# Patient Record
Sex: Male | Born: 1975 | Race: Asian | Hispanic: No | Marital: Married | Smoking: Never smoker
Health system: Southern US, Community
[De-identification: ages and names within clinical notes are randomized; demographics above are authoritative.]

## PROBLEM LIST (undated history)

## (undated) DIAGNOSIS — R7611 Nonspecific reaction to tuberculin skin test without active tuberculosis: Secondary | ICD-10-CM

## (undated) DIAGNOSIS — R202 Paresthesia of skin: Secondary | ICD-10-CM

## (undated) DIAGNOSIS — R42 Dizziness and giddiness: Secondary | ICD-10-CM

## (undated) HISTORY — DX: Paresthesia of skin: R20.2

## (undated) HISTORY — DX: Nonspecific reaction to tuberculin skin test without active tuberculosis: R76.11

---

## 2008-06-30 ENCOUNTER — Emergency Department (HOSPITAL_COMMUNITY): Admission: EM | Admit: 2008-06-30 | Discharge: 2008-06-30 | Payer: Self-pay | Admitting: Emergency Medicine

## 2008-08-15 ENCOUNTER — Ambulatory Visit: Admission: RE | Admit: 2008-08-15 | Discharge: 2008-08-15 | Payer: Self-pay | Admitting: Internal Medicine

## 2008-10-10 ENCOUNTER — Encounter: Admission: RE | Admit: 2008-10-10 | Discharge: 2008-10-10 | Payer: Self-pay | Admitting: Infectious Diseases

## 2008-12-19 ENCOUNTER — Encounter: Admission: RE | Admit: 2008-12-19 | Discharge: 2008-12-19 | Payer: Self-pay | Admitting: Internal Medicine

## 2009-01-21 ENCOUNTER — Encounter: Admission: RE | Admit: 2009-01-21 | Discharge: 2009-01-21 | Payer: Self-pay | Admitting: Internal Medicine

## 2009-04-29 ENCOUNTER — Emergency Department (HOSPITAL_COMMUNITY): Admission: EM | Admit: 2009-04-29 | Discharge: 2009-04-30 | Payer: Self-pay | Admitting: Emergency Medicine

## 2010-04-07 ENCOUNTER — Encounter
Admission: RE | Admit: 2010-04-07 | Discharge: 2010-04-07 | Payer: Self-pay | Source: Home / Self Care | Attending: Internal Medicine | Admitting: Internal Medicine

## 2010-04-25 ENCOUNTER — Encounter: Payer: Self-pay | Admitting: Internal Medicine

## 2010-04-26 ENCOUNTER — Encounter: Payer: Self-pay | Admitting: Internal Medicine

## 2010-05-27 ENCOUNTER — Emergency Department (HOSPITAL_COMMUNITY)
Admission: EM | Admit: 2010-05-27 | Discharge: 2010-05-28 | Disposition: A | Discharge status: Home / Self Care | Payer: 59 | Attending: Emergency Medicine | Admitting: Emergency Medicine

## 2010-05-27 DIAGNOSIS — J45909 Unspecified asthma, uncomplicated: Secondary | ICD-10-CM | POA: Insufficient documentation

## 2010-05-27 DIAGNOSIS — R1011 Right upper quadrant pain: Secondary | ICD-10-CM | POA: Insufficient documentation

## 2010-05-27 LAB — COMPREHENSIVE METABOLIC PANEL
AST: 24 U/L (ref 0–37)
Albumin: 4 g/dL (ref 3.5–5.2)
BUN: 7 mg/dL (ref 6–23)
Calcium: 9.4 mg/dL (ref 8.4–10.5)
Creatinine, Ser: 0.87 mg/dL (ref 0.4–1.5)
Total Bilirubin: 0.2 mg/dL — ABNORMAL LOW (ref 0.3–1.2)

## 2010-05-27 LAB — DIFFERENTIAL
Eosinophils Absolute: 0.5 10*3/uL (ref 0.0–0.7)
Eosinophils Relative: 6 % — ABNORMAL HIGH (ref 0–5)
Lymphs Abs: 2.5 10*3/uL (ref 0.7–4.0)
Monocytes Absolute: 0.7 10*3/uL (ref 0.1–1.0)
Monocytes Relative: 8 % (ref 3–12)

## 2010-05-27 LAB — LIPASE, BLOOD: Lipase: 45 U/L (ref 11–59)

## 2010-05-27 LAB — CBC
Platelets: 192 10*3/uL (ref 150–400)
RBC: 5.61 MIL/uL (ref 4.22–5.81)
RDW: 13.3 % (ref 11.5–15.5)
WBC: 8.9 10*3/uL (ref 4.0–10.5)

## 2010-05-28 LAB — URINALYSIS, ROUTINE W REFLEX MICROSCOPIC
Hgb urine dipstick: NEGATIVE
Ketones, ur: NEGATIVE mg/dL
Protein, ur: NEGATIVE mg/dL
Urine Glucose, Fasting: NEGATIVE mg/dL

## 2010-06-21 LAB — URINALYSIS, ROUTINE W REFLEX MICROSCOPIC
Bilirubin Urine: NEGATIVE
Hgb urine dipstick: NEGATIVE
Ketones, ur: NEGATIVE mg/dL
Nitrite: NEGATIVE
Protein, ur: NEGATIVE mg/dL
Specific Gravity, Urine: 1.016 (ref 1.005–1.030)
Urobilinogen, UA: 0.2 mg/dL (ref 0.0–1.0)

## 2010-06-21 LAB — COMPREHENSIVE METABOLIC PANEL
AST: 200 U/L — ABNORMAL HIGH (ref 0–37)
Albumin: 3.8 g/dL (ref 3.5–5.2)
Alkaline Phosphatase: 47 U/L (ref 39–117)
Chloride: 100 mEq/L (ref 96–112)
Creatinine, Ser: 0.84 mg/dL (ref 0.4–1.5)
Potassium: 3.3 mEq/L — ABNORMAL LOW (ref 3.5–5.1)
Total Bilirubin: 0.6 mg/dL (ref 0.3–1.2)
Total Protein: 7.1 g/dL (ref 6.0–8.3)

## 2010-06-21 LAB — DIFFERENTIAL
Basophils Absolute: 0 10*3/uL (ref 0.0–0.1)
Basophils Relative: 0 % (ref 0–1)
Eosinophils Absolute: 0.4 10*3/uL (ref 0.0–0.7)
Eosinophils Relative: 5 % (ref 0–5)
Monocytes Absolute: 0.7 10*3/uL (ref 0.1–1.0)
Monocytes Relative: 9 % (ref 3–12)

## 2010-06-21 LAB — HEPATITIS B SURFACE ANTIGEN: Hepatitis B Surface Ag: NEGATIVE

## 2010-06-21 LAB — CBC
MCHC: 33.7 g/dL (ref 30.0–36.0)
RBC: 5.22 MIL/uL (ref 4.22–5.81)
WBC: 8.1 10*3/uL (ref 4.0–10.5)

## 2010-06-21 LAB — HEPATIC FUNCTION PANEL
ALT: 310 U/L — ABNORMAL HIGH (ref 0–53)
Albumin: 3.5 g/dL (ref 3.5–5.2)
Alkaline Phosphatase: 46 U/L (ref 39–117)
Total Bilirubin: 0.4 mg/dL (ref 0.3–1.2)
Total Protein: 6.2 g/dL (ref 6.0–8.3)

## 2010-06-21 LAB — HEPATITIS PANEL, ACUTE
Hep A IgM: NEGATIVE
Hep B C IgM: NEGATIVE

## 2010-09-08 ENCOUNTER — Emergency Department (HOSPITAL_COMMUNITY)
Admission: EM | Admit: 2010-09-08 | Discharge: 2010-09-08 | Disposition: A | Discharge status: Home / Self Care | Payer: 59 | Attending: Emergency Medicine | Admitting: Emergency Medicine

## 2010-09-08 DIAGNOSIS — IMO0001 Reserved for inherently not codable concepts without codable children: Secondary | ICD-10-CM | POA: Insufficient documentation

## 2010-09-08 DIAGNOSIS — J45909 Unspecified asthma, uncomplicated: Secondary | ICD-10-CM | POA: Insufficient documentation

## 2010-09-08 LAB — URINALYSIS, ROUTINE W REFLEX MICROSCOPIC
Bilirubin Urine: NEGATIVE
Hgb urine dipstick: NEGATIVE
Nitrite: NEGATIVE
Protein, ur: NEGATIVE mg/dL
Urobilinogen, UA: 0.2 mg/dL (ref 0.0–1.0)

## 2010-09-08 LAB — POCT I-STAT, CHEM 8
Chloride: 104 mEq/L (ref 96–112)
Creatinine, Ser: 1 mg/dL (ref 0.4–1.5)
Glucose, Bld: 94 mg/dL (ref 70–99)
Potassium: 4.4 mEq/L (ref 3.5–5.1)

## 2010-10-25 ENCOUNTER — Emergency Department (HOSPITAL_COMMUNITY)
Admission: EM | Admit: 2010-10-25 | Discharge: 2010-10-25 | Disposition: A | Discharge status: Home / Self Care | Payer: 59 | Attending: Emergency Medicine | Admitting: Emergency Medicine

## 2010-10-25 DIAGNOSIS — R109 Unspecified abdominal pain: Secondary | ICD-10-CM | POA: Insufficient documentation

## 2010-10-25 LAB — COMPREHENSIVE METABOLIC PANEL
ALT: 33 U/L (ref 0–53)
AST: 27 U/L (ref 0–37)
CO2: 27 mEq/L (ref 19–32)
Calcium: 9.5 mg/dL (ref 8.4–10.5)
Chloride: 103 mEq/L (ref 96–112)
GFR calc non Af Amer: >60 mL/min (ref >60)
Sodium: 138 mEq/L (ref 135–145)

## 2010-10-25 LAB — CBC
MCH: 27.4 pg (ref 26.0–34.0)
Platelets: 168 10*3/uL (ref 150–400)
RBC: 5.36 MIL/uL (ref 4.22–5.81)
WBC: 7.5 10*3/uL (ref 4.0–10.5)

## 2010-10-25 LAB — DIFFERENTIAL
Basophils Relative: 0 % (ref 0–1)
Eosinophils Absolute: 0.6 10*3/uL (ref 0.0–0.7)
Neutrophils Relative %: 46 % (ref 43–77)

## 2010-10-25 LAB — LIPASE, BLOOD: Lipase: 30 U/L (ref 11–59)

## 2011-01-12 ENCOUNTER — Emergency Department (HOSPITAL_COMMUNITY)
Admission: EM | Admit: 2011-01-12 | Discharge: 2011-01-12 | Disposition: A | Discharge status: Home / Self Care | Payer: 59 | Attending: Emergency Medicine | Admitting: Emergency Medicine

## 2011-01-12 DIAGNOSIS — W19XXXA Unspecified fall, initial encounter: Secondary | ICD-10-CM | POA: Insufficient documentation

## 2011-01-12 DIAGNOSIS — S335XXA Sprain of ligaments of lumbar spine, initial encounter: Secondary | ICD-10-CM | POA: Insufficient documentation

## 2011-01-12 DIAGNOSIS — Y990 Civilian activity done for income or pay: Secondary | ICD-10-CM | POA: Insufficient documentation

## 2011-01-12 DIAGNOSIS — M545 Low back pain, unspecified: Secondary | ICD-10-CM | POA: Insufficient documentation

## 2011-04-12 ENCOUNTER — Ambulatory Visit (INDEPENDENT_AMBULATORY_CARE_PROVIDER_SITE_OTHER): Payer: 59 | Admitting: Infectious Diseases

## 2011-04-12 ENCOUNTER — Encounter: Payer: Self-pay | Admitting: Infectious Diseases

## 2011-04-12 DIAGNOSIS — R7611 Nonspecific reaction to tuberculin skin test without active tuberculosis: Secondary | ICD-10-CM

## 2011-04-12 DIAGNOSIS — IMO0001 Reserved for inherently not codable concepts without codable children: Secondary | ICD-10-CM

## 2011-04-12 DIAGNOSIS — M791 Myalgia, unspecified site: Secondary | ICD-10-CM

## 2011-04-12 NOTE — Assessment & Plan Note (Signed)
He has been challenged twice with INH. He developed hepatitis apparently both of these times. He is was then treated with rifampin for 4 months by his history. We'll forward a copy of this note to the health department, Dr. Burnice Logan and ask that they comment on this. If this is in fact his history he needs no further therapy for this.

## 2011-04-12 NOTE — Assessment & Plan Note (Addendum)
The etiology of his syndrome is unclear. His diagnostic testing has been negative to date, including Lyme disease which is not endemic in this area. I would suggest that he follows up with his primary care physician Dr. Ludwig Clarks as well as a [facility]. I agree that he most likely has fibromyalgia or possibly a psychosomatic process. He states he is taking 6 ibuprofen at a time for his pain. Hopefully this can be improved.
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
## 2017-11-03 ENCOUNTER — Other Ambulatory Visit: Payer: Self-pay

## 2017-11-03 ENCOUNTER — Encounter (HOSPITAL_COMMUNITY): Payer: Self-pay | Admitting: Emergency Medicine

## 2017-11-03 ENCOUNTER — Emergency Department (HOSPITAL_COMMUNITY): Payer: Medicaid Other

## 2017-11-03 ENCOUNTER — Emergency Department (HOSPITAL_COMMUNITY)
Admission: EM | Admit: 2017-11-03 | Discharge: 2017-11-04 | Disposition: A | Discharge status: Home / Self Care | Payer: Medicaid Other | Attending: Emergency Medicine | Admitting: Emergency Medicine

## 2017-11-03 DIAGNOSIS — J45901 Unspecified asthma with (acute) exacerbation: Secondary | ICD-10-CM | POA: Diagnosis not present

## 2017-11-03 DIAGNOSIS — M542 Cervicalgia: Secondary | ICD-10-CM

## 2017-11-03 DIAGNOSIS — R0789 Other chest pain: Secondary | ICD-10-CM | POA: Insufficient documentation

## 2017-11-03 DIAGNOSIS — Z79899 Other long term (current) drug therapy: Secondary | ICD-10-CM | POA: Diagnosis not present

## 2017-11-03 HISTORY — DX: Dizziness and giddiness: R42

## 2017-11-03 LAB — I-STAT TROPONIN, ED: TROPONIN I, POC: 0 ng/mL (ref 0.00–0.08)

## 2017-11-03 MED ORDER — IPRATROPIUM-ALBUTEROL 0.5-2.5 (3) MG/3ML IN SOLN
3.0000 mL | Freq: Once | RESPIRATORY_TRACT | Status: AC
  Administered 2017-11-03: 3 mL via RESPIRATORY_TRACT
  Filled 2017-11-03: qty 3

## 2017-11-03 NOTE — ED Provider Notes (Addendum)
[facility] EMERGENCY [facility]MENT Provider Note   CSN: [id number] Arrival date & time: 11/03/17  2024     History   Chief Complaint Chief Complaint  Patient presents with  . Neck Pain  . Chest Pain    HPI Ralph Robinson is a 42 y.o. male presenting with right-sided neck pain for the past 4 days.  Patient states that the pain is throbbing in nature worse with turning his head to the left and palpation to the area.  Patient states that he has not taken anything for his pain.  Patient states that the pain does not radiate anywhere but stays in his neck, patient denies injury or falls, patient denies vision changes or fever.  Additionally patient endorses 1 week of intermittent chest pain, he states that the pain is in the center of his chest and feels like a pressure, 4/10 in severity.  Patient states that pain is worse with strenuous activity and work.  Patient states that he has never experienced pain like this before and he has not taken anything for his pain.  Patient denies nausea/vomiting or diaphoresis.  Additionally patient endorses 1 week of runny nose and congestion.  Patient states that he took a Mucinex which has helped with his rhinorrhea and congestion however now he feels like his asthma has flared up, he endorses 4 days of increased cough with clear sputum production.  Patient denies leg swelling, recent surgeries/trauma, recent immobilization or hormone use.  Patient denies history of cancer or blood clots.  Patient denies history of diabetes.  Patient denies nausea/vomiting, diaphoresis, numbness/weakness or tingling.  Patient denies history of smoking, heart disease, high blood pressure or hyperlipidemia. HPI  Past Medical History:  Diagnosis Date  . Asthma   . 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 Vertigo     Patient Active Problem List   Diagnosis Date Noted  . 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 Muscle ache 04/12/2011     History reviewed. No pertinent surgical history.      Home Medications    Prior to Admission medications   Medication Sig Start Date End Date Taking? Authorizing Provider  albuterol (PROVENTIL HFA;VENTOLIN HFA) 108 (90 BASE) MCG/ACT inhaler Inhale 2 puffs into the lungs every 6 (six) hours as needed.      [provider]  cyclobenzaprine (FLEXERIL) 10 MG tablet Take 10 mg by mouth at bedtime as needed.      [provider]  GABAPENTIN, PHN, PO Take by mouth. Take 900 mg twice a day and 600 mg at noon.    [provider]  lidocaine (LIDODERM) 5 % Place 1 patch onto the skin daily. Remove & Discard patch within 12 hours or as directed by MD 11/04/17   Bill SalinasMorelli, Brandon A, PA-C  methocarbamol (ROBAXIN) 500 MG tablet Take 1 tablet (500 mg total) by mouth 2 (two) times daily. 11/04/17   Harlene SaltsMorelli, Brandon A, PA-C  Vilazodone HCl (VIIBRYD) 40 MG TABS Take 1 tablet by mouth daily.      [provider]    Family History No family history on file.  Social History Social History   Tobacco Use  . Smoking status: Never Smoker  . Smokeless tobacco: Never Used  Substance Use Topics  . Alcohol use: No  . Drug use: No     Allergies   Patient has no known allergies.   Review of Systems Review of Systems  Constitutional: Negative.  Negative  for chills, fatigue and fever.  HENT: Positive for congestion and rhinorrhea. Negative for sore throat, trouble swallowing and voice change.   Eyes: Negative.  Negative for visual disturbance.  Respiratory: Positive for cough and wheezing. Negative for shortness of breath.   Cardiovascular: Positive for chest pain. Negative for leg swelling.  Gastrointestinal: Negative.  Negative for abdominal pain, blood in stool, diarrhea, nausea and vomiting.  Genitourinary: Negative.  Negative for dysuria and hematuria.  Musculoskeletal: Positive for neck pain. Negative for arthralgias and myalgias.  Skin: Negative.  Negative for  rash.  Neurological: Negative.  Negative for dizziness, syncope, weakness, light-headedness, numbness and headaches.     Physical Exam Updated Vital Signs BP 97/74 (BP Location: Right Arm)   Pulse 80   Temp 98.5 F (36.9 C) (Oral)   Resp 13   Ht 5\' 6"  (1.676 m)   Wt 81.6 kg (180 lb)   SpO2 96%   BMI 29.05 kg/m   Physical Exam  Constitutional: He is oriented to person, place, and time. He appears well-developed and well-nourished. No distress.  HENT:  Head: Normocephalic and atraumatic.  Right Ear: External ear normal.  Left Ear: External ear normal.  Nose: Nose normal.  Eyes: Pupils are equal, round, and reactive to light. EOM are normal.  Neck: Trachea normal, normal range of motion and phonation normal. Neck supple. No JVD present. Muscular tenderness present. No spinous process tenderness present. No neck rigidity. No tracheal deviation, no edema, no erythema and normal range of motion present.    Cardiovascular: Normal rate, regular rhythm, normal heart sounds, intact distal pulses and normal pulses.  Pulmonary/Chest: Effort normal. No accessory muscle usage. No respiratory distress. He has wheezes. He exhibits no tenderness, no crepitus, no deformity and no swelling.  Bilateral expiratory wheezing present.  Abdominal: Soft. There is no tenderness. There is no rebound and no guarding.  Musculoskeletal: Normal range of motion. He exhibits no deformity.       Cervical back: He exhibits tenderness. He exhibits normal range of motion, no bony tenderness, no swelling and no deformity.       Thoracic back: Normal.       Lumbar back: Normal.       Back:       Right lower leg: Normal. He exhibits no tenderness, no swelling and no edema.       Left lower leg: Normal. He exhibits no tenderness, no swelling and no edema.  No midline spinal tenderness to palpation, no deformity, crepitus, or step-off noted    Neurological: He is alert and oriented to person, place, and time. He  has normal strength. No cranial nerve deficit or sensory deficit.  Mental Status: Alert, oriented, thought content appropriate, able to give a coherent history. Speech fluent without evidence of aphasia. Able to follow 2 step commands without difficulty. Cranial Nerves: II: Peripheral visual fields grossly normal, pupils equal, round, reactive to light III,IV, VI: ptosis not present, extra-ocular motions intact bilaterally V,VII: smile symmetric, eyebrows raise symmetric, facial light touch sensation equal VIII: hearing grossly normal to voice X: uvula elevates symmetrically XI: bilateral shoulder shrug symmetric and strong XII: midline tongue extension without fassiculations Motor: Normal tone. 5/5 strength in upper and lower extremities bilaterally including strong and equal grip strength and dorsiflexion/plantar flexion Sensory: Sensation intact to light touch in all extremities. Cerebellar: normal finger-to-nose with bilateral upper extremities.  No pronator drift.  Gait: normal gait and balance CV: distal pulses palpable throughout   Skin: Skin is warm  and dry. Capillary refill takes less than 2 seconds.  Psychiatric: He has a normal mood and affect. His behavior is normal.     ED Treatments / Results  Labs (all labs ordered are listed, but only abnormal results are displayed) Labs Reviewed  BASIC METABOLIC PANEL  CBC  I-STAT TROPONIN, ED  I-STAT TROPONIN, ED  I-STAT TROPONIN, ED    EKG EKG Interpretation  Date/Time:  Friday November 03 2017 23:27:27 EDT Ventricular Rate:  88 PR Interval:    QRS Duration: 93 QT Interval:  347 QTC Calculation: 420 R Axis:   64 Text Interpretation:  Sinus rhythm ST elev, probable normal early repol pattern No significant change since last tracing Confirmed by Zadie Rhine ([id number]) on 11/04/2017 2:01:23 AM   Radiology Dg Chest 2 View  Result Date: 11/03/2017 CLINICAL DATA:  Chest pain right-sided neck pain EXAM: CHEST - 2  VIEW COMPARISON:  10/10/2008 FINDINGS: The heart size and mediastinal contours are within normal limits. Both lungs are clear. The visualized skeletal structures are unremarkable. IMPRESSION: No active cardiopulmonary disease. Electronically Signed   By: Jasmine Pang M.D.   On: 11/03/2017 23:09    Procedures Procedures (including critical care time)  Medications Ordered in ED Medications  albuterol (PROVENTIL HFA;VENTOLIN HFA) 108 (90 Base) MCG/ACT inhaler 1 puff (1 puff Inhalation Given 11/04/17 0256)  ipratropium-albuterol (DUONEB) 0.5-2.5 (3) MG/3ML nebulizer solution 3 mL (3 mLs Nebulization Given 11/03/17 2340)     Initial Impression / Assessment and Plan / ED Course  I have reviewed the triage vital signs and the nursing notes.  Pertinent labs & imaging results that were available during my care of the patient were reviewed by me and considered in my medical decision making (see chart for details).  Clinical Course as of Dec 01 901  Sat Nov 04, 2017  0150 On reevaluation patient is sleeping comfortably in room.  Patient easily arousable.  Patient states that he feels his breathing is much improved after the first breathing treatment.  Patient denying chest pain at this time.   [BM]  [id number] On reassessment patient is sleeping comfortably.  Patient denies chest pain at this time.   [BM]  0242 Second troponin was drawn approximately 30 minutes prior to the 3-hour mark.  I have explained to the patient that we must withdrawal the last troponin again to ensure full work-up at this time.  He is agreeable.   [BM]  0335 Patient is ambulatory leaving department, NAD.   [BM]    Clinical Course User Index [BM] Bill Salinas, PA-C   Saquan Nehme is a 42 y.o. male who presents to ED for right sided neck pain. On exam, patient is with negative NEXUS (no midline spinal tenderness, no focal neuro deficits, no evidence of intoxication is present, normal level of alertness, no painful  distracting injury).  Signs and symptoms most likely due to right trapezius and right paraspinal cervical muscle tenderness/spasm. Will give rx for muscle relaxer and Lidoderm patch. Symptomatic home care instructions and return precautions discussed. Strongly encouraged to follow up with primary-care provider. All questions answered.  Chest pain is not likely of cardiac or pulmonary etiology d/t presentation, perc negative, VSS, no tracheal deviation, no JVD or new murmur, RRR, breath sounds equal bilaterally, EKG without acute abnormalities, negative troponin x2, and negative CXR.  Patient under 8 years of age, not tachycardic, SPO2 96% on room air, no leg swelling, no hemoptysis, denies recent surgery or trauma, denies history of DVT or  PE, denies hormone use.  Patient denying any and all pain at time of discharge. Patient with recent upper respiratory illness and asthma exacerbation, it is likely that his pain was related to this.  Patient's trouble breathing/pain was relieved with one DuoNeb in department today.  Patient states that he has inhalers at home that he uses however he would like a refill today.  Refill has been provided.  Patient's lungs are clear to auscultation bilaterally upon on reevaluation.  Patient is resting comfortably, no acute distress.  Heart score of 1 at this time: 1 point for moderately suspicious history.  Pt has been advised to return to the ED is CP becomes exertional, associated with diaphoresis or nausea, radiates to left jaw/arm, worsens or becomes concerning in any way. Pt appears reliable for follow up and is agreeable to discharge.   At this time there does not appear to be any evidence of an acute emergency medical condition and the patient appears stable for discharge with appropriate outpatient follow up. Diagnosis was discussed with patient who verbalizes understanding of care plan and is agreeable to discharge. I have discussed return precautions with patient  who verbalizes understanding of return precautions. Patient strongly encouraged to follow-up with their PCP. All questions answered.  Case has been discussed with Dr. Judd Lien who agrees with the above plan to discharge with outpatient follow-up.     Note: Portions of this report may have been transcribed using voice recognition software. Every effort was made to ensure accuracy; however, inadvertent computerized transcription errors may still be present.   Final Clinical Impressions(s) / ED Diagnoses   Final diagnoses:  Atypical chest pain  Neck pain  Moderate asthma with exacerbation, unspecified whether persistent    ED Discharge Orders        Ordered    lidocaine (LIDODERM) 5 %  Every 24 hours     11/04/17 0253    methocarbamol (ROBAXIN) 500 MG tablet  2 times daily     11/04/17 0253       Bill Salinas, PA-C 11/04/17 0327    Bill Salinas, PA-C 12/01/17 1610    Geoffery Lyons, MD 12/01/17 2259

## 2017-11-03 NOTE — ED Notes (Signed)
Per APP, pt will require cardiac work up, and monitoring.  Will upgrade acuity.

## 2017-11-03 NOTE — ED Notes (Signed)
Pt transported to xray 

## 2017-11-03 NOTE — ED Triage Notes (Signed)
C/o posterior and right sided neck pain x 4 days.  Denies injury.

## 2017-11-04 LAB — CBC
HEMATOCRIT: 46.5 % (ref 39.0–52.0)
HEMOGLOBIN: 14.5 g/dL (ref 13.0–17.0)
MCH: 26.2 pg (ref 26.0–34.0)
MCHC: 31.2 g/dL (ref 30.0–36.0)
MCV: 83.9 fL (ref 78.0–100.0)
Platelets: 180 10*3/uL (ref 150–400)
RBC: 5.54 MIL/uL (ref 4.22–5.81)
RDW: 14 % (ref 11.5–15.5)
WBC: 9.7 10*3/uL (ref 4.0–10.5)

## 2017-11-04 LAB — I-STAT TROPONIN, ED
Troponin i, poc: 0 ng/mL (ref 0.00–0.08)
Troponin i, poc: 0 ng/mL (ref 0.00–0.08)

## 2017-11-04 LAB — BASIC METABOLIC PANEL
ANION GAP: 10 (ref 5–15)
BUN: 6 mg/dL (ref 6–20)
CALCIUM: 9.7 mg/dL (ref 8.9–10.3)
CO2: 26 mmol/L (ref 22–32)
Chloride: 102 mmol/L (ref 98–111)
Creatinine, Ser: 0.97 mg/dL (ref 0.61–1.24)
GFR calc non Af Amer: >60 mL/min (ref >60)
GLUCOSE: 90 mg/dL (ref 70–99)
Potassium: 3.7 mmol/L (ref 3.5–5.1)
Sodium: 138 mmol/L (ref 135–145)

## 2017-11-04 MED ORDER — ALBUTEROL SULFATE HFA 108 (90 BASE) MCG/ACT IN AERS
1.0000 | INHALATION_SPRAY | RESPIRATORY_TRACT | Status: DC
  Administered 2017-11-04: 1 via RESPIRATORY_TRACT
  Filled 2017-11-04: qty 6.7

## 2017-11-04 MED ORDER — METHOCARBAMOL 500 MG PO TABS: 500.0000 mg | ORAL_TABLET | Freq: Two times a day (BID) | ORAL | 0 refills | Status: DC

## 2017-11-04 MED ORDER — LIDOCAINE 5 % EX PTCH: 1.0000 | MEDICATED_PATCH | CUTANEOUS | 0 refills | Status: DC

## 2017-11-04 NOTE — Discharge Instructions (Addendum)
Please return to the emergency department if you have any new or worsening symptoms. Please follow-up with your primary care provider regarding your visit today.  You may use the resources attached to establish a primary care provider if you do not already have one. You may use the muscle relaxer Robaxin as prescribed, please do not drive or operate heavy machinery while taking this medication. You may use the Lidoderm patch over your right trapezius muscle as needed and prescribed for pain.  Contact a health care provider if: Your chest pain does not go away. You have a rash with blisters on your chest. You have a fever. You have chills. Get help right away if: Your chest pain is worse. You have a cough that gets worse, or you cough up blood. You have severe pain in your abdomen. You have severe weakness. You faint. You have sudden, unexplained chest discomfort. You have sudden, unexplained discomfort in your arms, back, neck, or jaw. You have shortness of breath at any time. You suddenly start to sweat, or your skin gets clammy. You feel nauseous or you vomit. You suddenly feel light-headed or dizzy. Your heart begins to beat quickly, or it feels like it is skipping beats.  RESOURCE GUIDE  Chronic Pain Problems: Contact [facility]  [phone] Patients need to be referred by their primary care doctor.  Insufficient Money for Medicine: Contact United Way:  call "211" or [facility] [phone].  No Primary Care Doctor: Call Health Connect  [phone] - can help you locate a primary care doctor that  accepts your insurance, provides certain services, etc. Physician Referral Service- [phone]  Agencies that provide inexpensive medical care: [facility]  [phone] [facility]  [phone] [facility]  [phone] [facility]  [phone] [facility]  [phone] [facility]   [phone]  Medicaid-accepting [facility] Providers: [facility]- [address], [address]  [phone], Mon-Fri 9am-7pm, Sat 9am-1pm [facility]- [address], [address] [place]  [phone] [facility]- [address], [address] [place]  [phone] [facility]- [address] [place] [address]  [phone] Renaye RakersVeita Bland- [address], [address], [phone]  Only accepts [place] patients after they have their name  applied to their card  Self Pay (no insurance) in [facility]: Sickle Cell Patients: Dr Willey BladeEric Dean, [facility]  [address], [phone] [facility]- [address]  [phone]       Redge Gainer-     [facility] [place]- [address], [address]       -     [facility]- see information above (Speak to CitigroupPam H if you do not have insurance)       -  [facility]- [address], [phone]       -  [facility]- [address],  [phone]       -  [facility]- [address] [place] [address], [phone]       -  Dr Julio Sickssei-Bonsu-  [address], [address], [place], [phone]       -  [facility]- [address], [phone]       -  [facility]- [address] [place] [address], [phone], also [address], [phone]       -  [place], [place], 1st & 3rd Saturday   every month, 10am-1pm  1) Find a Doctor and Pay Out of Pocket Although you won't have to find out who is covered by your insurance plan, it is a good idea to ask around and get recommendations. You will then need to call the office and see if the doctor you have chosen will accept you as a new patient and what types of options they offer for patients who are self-pay. Some doctors offer discounts or will set up payment plans for their patients who do not have insurance, but you will need to ask so you aren't surprised when  you get to your appointment.  2) Contact Your Local Health Department Not all health departments have doctors that can see patients for sick visits, but many do, so it is worth a call to see if yours does. If you don't know where your local health department is, you can check in your phone book. The CDC also has a tool to help you locate your state's health department, and many state websites also have listings of all of their local health departments.  3) Find a [facility] If your illness is not likely to be very severe or complicated, you may want to try a walk in clinic. These are popping up all over the country in pharmacies, drugstores, and shopping centers. They're usually staffed by nurse practitioners or physician assistants that have been trained to treat common illnesses and complaints. They're usually fairly quick and inexpensive. However, if you have serious medical issues or chronic medical problems, these are probably not your best option  STD [place], [facility], [address], [place], phone [phone] or [phone].  Monday - Friday, call for an appointment. [place], [facility], [address], [place], phone [phone] or [phone].  Monday - Friday, call for an appointment.  Abuse/Neglect: [place] [phone] [place] [phone] (After Hours)  Emergency Shelter:  [facility] [phone]  Maternity Homes: [facility] [place] [phone] [place] [phone]  MRSA Hotline #:   (772)373-1098  [facility] [address]         [place] Phone:  [phone]                                  Phone:  [phone]                   Phone:  [place], [phone]       -  [facility] in [place], [address]                                  [place] [phone] or [phone] (After Hours)   [place]  Substance Abuse Resources: Alcohol and Drug Services  [place] [phone] [facility] [place] Chinita Pester [phone] Residential & Outpatient Substance Abuse Program  [phone]  Psychological Services: [place]  [phone] [place]  [place], [place] [address] [place], [place]: [phone] or [phone], PicCapture.uy  Dental Assistance  If unable to pay or uninsured, contact:  [facility] or [facility]. to become qualified for the adult dental clinic.  Patients with Medicaid: [facility] [phone] [address], [place] [address], [phone]  If unable to pay, or uninsured, contact [facility] [phone]) or [place] [phone] in [place], [address] [place] in [facility]) to become qualified for the adult dental clinic   Other [place]- [place], [place], [place], [address], [place], [place], 2nd and 4th Thursday of the month at 6:30am.  10 clients each day by appointment, can sometimes see walk-in patients if someone does not show for an appointment. [facility]- [address] Hillard Danker [place], [place], [address], [place], [place], [place], [address],  [place] Department- [phone] [facility] [phone]

## 2017-11-04 NOTE — ED Notes (Signed)
PA explained tests results and plan of care to pt. 

## 2018-01-24 ENCOUNTER — Telehealth: Payer: Self-pay | Admitting: Neurology

## 2018-01-24 ENCOUNTER — Ambulatory Visit: Payer: Medicaid Other | Admitting: Neurology

## 2018-01-24 ENCOUNTER — Encounter: Payer: Self-pay | Admitting: Neurology

## 2018-01-24 DIAGNOSIS — H814 Vertigo of central origin: Secondary | ICD-10-CM
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
## 2018-01-24 NOTE — Telephone Encounter (Signed)
medicaid order sent to [facility]. They obtain the auth and will reach out to the pt to schedule.  °

## 2018-01-24 NOTE — Telephone Encounter (Signed)
Spoke to the patient he is aware of this he also has their number of [phone] and to call them if he has not heard from them in the next 2-3 business days.

## 2018-02-27 ENCOUNTER — Telehealth: Payer: Self-pay | Admitting: Neurology

## 2018-02-27 NOTE — Telephone Encounter (Signed)
Pt calling to inform that he has not been contacted by the physical therapy office yet, pt is asking for a call to discuss

## 2018-02-27 NOTE — Telephone Encounter (Signed)
I have called spoke to patient and he is going to call about his PT [phone].

## 2018-03-05 ENCOUNTER — Other Ambulatory Visit: Payer: Self-pay

## 2018-03-05 ENCOUNTER — Ambulatory Visit: Payer: Medicaid Other | Attending: Neurology | Admitting: Physical Therapy

## 2018-03-05 ENCOUNTER — Encounter: Payer: Self-pay | Admitting: Physical Therapy

## 2018-03-05 DIAGNOSIS — R29818 Other symptoms and signs involving the nervous system: Secondary | ICD-10-CM | POA: Insufficient documentation

## 2018-03-05 DIAGNOSIS — R2681 Unsteadiness on feet: Secondary | ICD-10-CM | POA: Diagnosis present

## 2018-03-05 DIAGNOSIS — R42 Dizziness and giddiness: Secondary | ICD-10-CM | POA: Diagnosis not present

## 2018-03-05 NOTE — Patient Instructions (Signed)
Gaze Stabilization: Tip Card  1.Target must remain in focus, not blurry, and appear stationary while head is in motion. 2.Perform exercises with small head movements (30 to either side of midline). 3.Increase speed of head motion so long as target is in focus. 4.If you wear eyeglasses, be sure you can see target through lens (therapist will give specific instructions for bifocal / progressive lenses). 5.These exercises should provoke dizziness or nausea. Work through these symptoms. If too dizzy, slow head movement slightly. (Too dizzy is moderately dizzy or 5 out of 10 with a 10 meaning you're so dizzy you are falling).  Overall the symptoms of dizziness or nausea should resolve within 30 minutes of completing all repetitions. 6.Exercises demand concentration; avoid distractions. Begin with the target on a simple background unless instructed otherwise.      Special Instructions:  If symptoms are lasting longer than 30 minutes, modify your exercises by:    >decreasing the # of times you complete each activity >ensuring your symptoms return to baseline before moving onto the next exercise >dividing up exercises so you do not do them all in one session, but multiple short sessions throughout the day >doing them once a day until symptoms improve    For safety, perform standing exercises close to a counter, wall, corner, or next to someone.   Gaze Stabilization - Standing Feet Apart      Feet shoulder width apart, keeping eyes on target on wall 3 feet away, tilt head down slightly and move head side to side for _30-60___ seconds. Stop and rest 30 seconds. Repeat side to side 2 more times for a total of 3 repetitions.   Do __3__ sessions per day.   Copyright  VHI. All rights reserved.
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
## 2018-03-06 ENCOUNTER — Telehealth: Payer: Self-pay | Admitting: Physical Therapy

## 2018-03-06 NOTE — Telephone Encounter (Signed)
Dr. Pearlean BrownieSethi, Mr. Ralph Robinson was evaluated by PT on 03/05/18.  The patient would benefit from a Neuro-opthalmology evaluation for dysconjugate eye movements (see also PT evaluation with Clinical Impression copied below).    If you agree, please refer patient to a Neuro-ophthalmologist of your choosing. Below are two local practitioners:   Dr. Aura CampsMichael Spencer  [facility] [place], [place]  [phone]  Or   Dr. 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 Patient has been experiencing symptoms since ~Sept 2018 of leaning/tilting to his left when riding in car (driver or passenger) at speeds >45 mph. Today he had a positive head impulse test to the left and a 4 line difference for static vs dynamic visual acuity indicative of a left vestibular hypofunction. During convergence testing, pt unable to maintain left eye in adduction once target reaches 4 inches from his nose. Anticipate patient can benefit from PT for vestibular rehab to improve his tolerance for activity and function of peripheral vestibular structure. Anticipate patient may benefit from a referral to a neuro-opthalmologist to address left eye deviations (with movement). Will request referral from Dr. Pearlean BrownieSethi. (Of note, pt denies history of strabismus. Reports he has had same glasses with progressive lenses for >2 years). See full PT plan below for deficits to be addressed and interventions to be utilized.

## 2018-03-07 NOTE — Telephone Encounter (Signed)
I did not detect any eye symptoms during my visit and he had vestibular disorientation syndrome. I do not feel he needs neuropthalm referral at this point

## 2018-03-13 ENCOUNTER — Other Ambulatory Visit: Payer: Self-pay | Admitting: Neurology

## 2018-03-13 ENCOUNTER — Inpatient Hospital Stay: Admission: RE | Admit: 2018-03-13 | Payer: Self-pay | Source: Ambulatory Visit

## 2018-03-19 NOTE — Telephone Encounter (Signed)
Ralph Robinson with [facility] informed me that the MRI was denied.  "Based on [employer] Head Imaging Guidelines Section: HD 23.1 Dizziness or Vertigo and/or Syncope, we cannot approve this request. Your records show that you have dizzy spells. The reason this request cannot be approved is because: 1. The clinical information reviewed shows that the same test or one similar to the requested study was previously performed. The results of this prior imaging were provided, but they do not show why these results are not sufficient for the evaluation of the current clinical condition. Additional imaging is not supported without a clear reason why it is needed and, therefore, the request is not indicated at this time."  Clinical notes were uploaded on 03/13/18 to [employer].  If you would like to do a peer to peer their number is [phone] and the case number is [id number].

## 2018-03-20 NOTE — Telephone Encounter (Signed)
No there is no MRI for just MRI IAC it has to be MRI Brain w/wo contrast.

## 2018-03-20 NOTE — Telephone Encounter (Signed)
Ralph Robinson This pt had MRI brain w/wo [facility] a few weeks ago but I am interested in MRI IAC w/wo only but was unable to order it without MRI Brain again hence the denial. Is there a way to order MRI IAC w/wo only and not order MRI brain again. I have been on hold for 15 mins for peer to peer

## 2018-03-20 NOTE — Telephone Encounter (Signed)
Ok no need then we will just note that insurance denied it

## 2018-03-22 ENCOUNTER — Ambulatory Visit: Payer: Medicaid Other | Admitting: Rehabilitative and Restorative Service Providers"

## 2018-03-22 NOTE — Telephone Encounter (Signed)
I informed Ralph Robinson at [facility] yesterday 03/21/18 about this.. she tried to reach out to the patient to inform him the MRI was denied. She left a vmail.

## 2018-03-24 ENCOUNTER — Other Ambulatory Visit: Payer: Self-pay

## 2018-03-29 ENCOUNTER — Ambulatory Visit: Payer: Medicaid Other | Admitting: Physical Therapy

## 2018-04-05 ENCOUNTER — Ambulatory Visit: Payer: Medicaid Other | Admitting: Physical Therapy

## 2018-04-12 ENCOUNTER — Ambulatory Visit: Payer: Medicaid Other | Attending: Neurology | Admitting: Physical Therapy

## 2018-04-12 DIAGNOSIS — R29818 Other symptoms and signs involving the nervous system: Secondary | ICD-10-CM | POA: Insufficient documentation

## 2018-04-12 DIAGNOSIS — R42 Dizziness and giddiness: Secondary | ICD-10-CM | POA: Insufficient documentation

## 2018-04-12 DIAGNOSIS — R2681 Unsteadiness on feet: Secondary | ICD-10-CM | POA: Insufficient documentation

## 2018-04-13 ENCOUNTER — Encounter: Payer: Self-pay | Admitting: Physical Therapy

## 2018-04-13 ENCOUNTER — Ambulatory Visit: Payer: Medicaid Other | Admitting: Physical Therapy

## 2018-04-13 DIAGNOSIS — R29818 Other symptoms and signs involving the nervous system: Secondary | ICD-10-CM

## 2018-04-13 DIAGNOSIS — R42 Dizziness and giddiness: Secondary | ICD-10-CM | POA: Diagnosis present

## 2018-04-13 DIAGNOSIS — R2681 Unsteadiness on feet: Secondary | ICD-10-CM

## 2018-04-13 NOTE — Patient Instructions (Addendum)
Gaze Stabilization: Tip Card  1.Target must remain in focus, not blurry, and appear stationary while head is in motion. 2.Perform exercises with small head movements (30 to either side of midline). 3.Increase speed of head motion so long as target is in focus. 4.If you wear eyeglasses, be sure you can see target through lens (therapist will give specific instructions for bifocal / progressive lenses). 5.These exercises should provoke dizziness or nausea. Work through these symptoms. If too dizzy, slow head movement slightly. (Too dizzy is moderately dizzy or 5 out of 10 with a 10 meaning you're so dizzy you are falling).  Overall the symptoms of dizziness or nausea should resolve within 30 minutes of completing all repetitions. 6.Exercises demand concentration; avoid distractions. Begin with the target on a simple background unless instructed otherwise.      Special Instructions:  If symptoms are lasting longer than 30 minutes, modify your exercises by:    >decreasing the # of times you complete each activity >ensuring your symptoms return to baseline before moving onto the next exercise >dividing up exercises so you do not do them all in one session, but multiple short sessions throughout the day >doing them once a day until symptoms improve     Gaze Stabilization - Tip Card  For safety, perform standing exercises close to a counter, wall, corner, or next to someone.   Gaze Stabilization - Standing Feet Apart   Feet shoulder width apart, keeping eyes on target on wall 3 feet away, tilt head down slightly and move head side to side for _30-60___ seconds. Rest and repeat side to side. Then repeat while moving head up and down for __30-60__ seconds. Rest and repeat up and down. (ie do each direction two times)  Do __2-3__ sessions per day.
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
## 2018-04-16 ENCOUNTER — Ambulatory Visit (INDEPENDENT_AMBULATORY_CARE_PROVIDER_SITE_OTHER): Payer: Medicaid Other | Admitting: Neurology

## 2018-04-16 ENCOUNTER — Other Ambulatory Visit: Payer: Self-pay

## 2018-04-16 ENCOUNTER — Encounter: Payer: Self-pay | Admitting: Neurology

## 2018-04-16 VITALS — BP 120/82 | HR 68 | Resp 18 | Ht 66.0 in | Wt 176.4 lb

## 2018-04-16 DIAGNOSIS — R42 Dizziness and giddiness: Secondary | ICD-10-CM
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
## 2018-04-16 NOTE — Patient Instructions (Signed)
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  Continue vestibular stabilization exercises.  Unfortunately there is no specific medication that I can prescribe for his dizziness which will not impair his consciousness and driving.  He return for follow-up in the future only as necessary and no routine schedule appointment was made.

## 2018-04-18 ENCOUNTER — Ambulatory Visit: Payer: Medicaid Other | Admitting: Rehabilitative and Restorative Service Providers"

## 2018-04-23 ENCOUNTER — Ambulatory Visit: Payer: Medicaid Other | Admitting: Rehabilitative and Restorative Service Providers"

## 2018-04-23 ENCOUNTER — Encounter: Payer: Self-pay | Admitting: Rehabilitative and Restorative Service Providers"

## 2018-04-23 DIAGNOSIS — R29818 Other symptoms and signs involving the nervous system: Secondary | ICD-10-CM

## 2018-04-23 DIAGNOSIS — R2681 Unsteadiness on feet: Secondary | ICD-10-CM

## 2018-04-23 DIAGNOSIS — R42 Dizziness and giddiness: Secondary | ICD-10-CM

## 2018-04-23 NOTE — Patient Instructions (Signed)
Gaze Stabilization: Tip Card  1.Target must remain in focus, not blurry, and appear stationary while head is in motion. 2.Perform exercises with small head movements (30 to either side of midline). 3.Increase speed of head motion so long as target is in focus. 4.If you wear eyeglasses, be sure you can see target through lens (therapist will give specific instructions for bifocal / progressive lenses). 5.These exercises should provoke dizziness or nausea. Work through these symptoms. If too dizzy, slow head movement slightly. (Too dizzy is moderately dizzy or 5 out of 10 with a 10 meaning you're so dizzy you are falling).  Overall the symptoms of dizziness or nausea should resolve within 30 minutes of completing all repetitions. 6.Exercises demand concentration; avoid distractions. Begin with the target on a simple background unless instructed otherwise.      Special Instructions:  If symptoms are lasting longer than 30 minutes, modify your exercises by:               >decreasing the # of times you complete each activity >ensuring your symptoms return to baseline before moving onto the next exercise >dividing up exercises so you do not do them all in one session, but multiple short sessions throughout the day >doing them once a day until symptoms improve     Gaze Stabilization - Tip Card  For safety, perform standing exercises close to a counter, wall, corner, or next to someone.   Gaze Stabilization - Standing Feet Apart   Feet shoulder width apart, keeping eyes on target on wall 3 feet away, tilt head down slightly and move head side to side for _30-60___ seconds. Rest and repeat side to side. Then repeat while moving head up and down for __30-60__ seconds. Rest and repeat up and down. (ie do each direction two times)  Do __2-3__ sessions per day.    Feet Together (Compliant Surface) Varied Arm Positions - Eyes Closed    Stand on compliant surface: __pillow___  with feet together and arms out. Close eyes and visualize upright position. Hold___30_ seconds. Repeat __3__ times per session. Do __2 Copyright  VHI. All rights reserved.    Feet Apart (Compliant Surface) Head Motion - Eyes Open    With eyes open, standing on compliant surface: ___pillow_, feet shoulder width apart, move head slowly: up and down x 10, side to side x 10 reps, and then diagonal movement x 10 reps.  Do __2__ sessions per day.  Copyright  VHI. All rights reserved.   Throwing / Tossing (Active)    Toss a tennis ball or crumpled piece of paper.  Can do in standing and add walking to the ball toss.   Toss from one hand to the other while walking. Repeat __10__ times. Do _2___ sessions per day.  Copyright  VHI. All rights reserved.
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
## 2018-04-30 ENCOUNTER — Ambulatory Visit: Payer: Medicaid Other | Admitting: Rehabilitative and Restorative Service Providers"

## 2018-04-30 ENCOUNTER — Encounter: Payer: Self-pay | Admitting: Rehabilitative and Restorative Service Providers"

## 2018-04-30 DIAGNOSIS — R2681 Unsteadiness on feet: Secondary | ICD-10-CM

## 2018-04-30 DIAGNOSIS — R29818 Other symptoms and signs involving the nervous system: Secondary | ICD-10-CM | POA: Diagnosis not present

## 2018-04-30 DIAGNOSIS — R42 Dizziness and giddiness: Secondary | ICD-10-CM

## 2018-04-30 NOTE — Patient Instructions (Addendum)
Gaze Stabilization: Tip Card  1.Target must remain in focus, not blurry, and appear stationary while head is in motion. 2.Perform exercises with small head movements (30 to either side of midline). 3.Increase speed of head motion so long as target is in focus. 4.If you wear eyeglasses, be sure you can see target through lens (therapist will give specific instructions for bifocal / progressive lenses). 5.These exercises should provoke dizziness or nausea. Work through these symptoms. If too dizzy, slow head movement slightly. (Too dizzy is moderately dizzy or 5 out of 10 with a 10 meaning you're so dizzy you are falling). Overall the symptoms of dizziness or nausea should resolve within 30 minutes of completing all repetitions. 6.Exercises demand concentration; avoid distractions. Begin with the target on a simple background unless instructed otherwise.      Special Instructions: If symptoms are lasting longer than 30 minutes, modify your exercises by:   >decreasing the # of times you complete each activity >ensuring your symptoms return to baseline before moving onto the next exercise >dividing up exercises so you do not do them all in one session, but multiple short sessions throughout the day >doing them once a day until symptoms improve     Gaze Stabilization - Tip Card  For safety, perform standing exercises close to a counter, wall, corner, or next to someone.   Gaze Stabilization - Standing Feet Apart   Feet shoulder width apart, keeping eyes on target on wall 3 feet away, tilt head down slightly and move head side to side for _30-60___ seconds. Rest and repeat side to side. Then repeat while moving head up and down for __30-60__ seconds. Rest and repeat up and down. (ie do each direction two times).  PROGRESS TO WALKING IN THE HOUSE (level ground/ good lighting).  Walk while holding the letter in front of you moving forwards.  Do __2-3__ sessions per  day.     Feet Together (Compliant Surface) Varied Arm Positions - Eyes Closed    Stand on compliant surface: __pillow___ with feet together and arms out. Close eyes and visualize upright position. Hold___30_ seconds. Repeat __3__ times per session. Do __2 Copyright  VHI. All rights reserved.    Feet Apart (Compliant Surface) Head Motion - Eyes Open    With eyes open, standing on compliant surface: ___pillow_, feet shoulder width apart, move head slowly: up and down x 10, side to side x 10 reps, and then diagonal movement x 10 reps.  Do __2__ sessions per day.  Copyright  VHI. All rights reserved.   Throwing / Tossing (Active)    Toss a tennis ball or crumpled piece of paper.  Can do in standing and add walking to the ball toss.   Toss from one hand to the other while walking. Repeat __10__ times. Do _2___ sessions per day.  Copyright  VHI. All rights reserved.
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
## 2018-05-11 ENCOUNTER — Encounter

## 2018-05-18 ENCOUNTER — Ambulatory Visit: Payer: Medicaid Other | Attending: Neurology | Admitting: Physical Therapy

## 2018-05-25 ENCOUNTER — Ambulatory Visit: Payer: Medicaid Other | Admitting: Physical Therapy

## 2018-06-12 ENCOUNTER — Ambulatory Visit: Payer: Medicaid Other | Admitting: Physical Therapy

## 2018-09-28 ENCOUNTER — Other Ambulatory Visit: Payer: Self-pay | Admitting: Internal Medicine

## 2018-09-28 DIAGNOSIS — Z20822 Contact with and (suspected) exposure to covid-19: Secondary | ICD-10-CM

## 2018-10-03 LAB — NOVEL CORONAVIRUS, NAA: SARS-CoV-2, NAA: NOT DETECTED

## 2019-07-04 ENCOUNTER — Ambulatory Visit: Payer: Medicaid Other | Attending: Internal Medicine

## 2019-07-04 DIAGNOSIS — Z23 Encounter for immunization: Secondary | ICD-10-CM

## 2019-07-04 NOTE — Progress Notes (Signed)
   Covid-19 Vaccination Clinic  Name:  Aiyden Lauderback    MRN: [id number] DOB: 1975-04-21  07/04/2019  Mr. Delman was observed post Covid-19 immunization for 15 minutes without incident. He was provided with Vaccine Information Sheet and instruction to access the V-Safe system.   Mr. Oleksy was instructed to call 911 with any severe reactions post vaccine: Marland Kitchen Difficulty breathing  . Swelling of face and throat  . A fast heartbeat  . A bad rash all over body  . Dizziness and weakness   Immunizations Administered    Name Date Dose VIS Date Route   Pfizer COVID-19 Vaccine 07/04/2019  9:28 AM 0.3 mL 03/15/2019 Intramuscular   Manufacturer: [employer], [employer]   Lot: [id number]   NDC: [address]-7

## 2019-07-29 ENCOUNTER — Ambulatory Visit: Payer: Medicaid Other | Attending: Internal Medicine

## 2019-07-29 DIAGNOSIS — Z23 Encounter for immunization: Secondary | ICD-10-CM

## 2019-07-29 NOTE — Progress Notes (Signed)
   Covid-19 Vaccination Clinic  Name:  Roshaun Pound    MRN: [id number] DOB: 07-18-1975  07/29/2019  Mr. Woolever was observed post Covid-19 immunization for 30 minutes based on pre-vaccination screening without incident. He was provided with Vaccine Information Sheet and instruction to access the V-Safe system.   Mr. Drumwright was instructed to call 911 with any severe reactions post vaccine: Marland Kitchen Difficulty breathing  . Swelling of face and throat  . A fast heartbeat  . A bad rash all over body  . Dizziness and weakness   Immunizations Administered    Name Date Dose VIS Date Route   Pfizer COVID-19 Vaccine 07/29/2019 10:09 AM 0.3 mL 05/29/2018 Intramuscular   Manufacturer: [employer], [employer]   Lot: [id number]   NDC: [address]-6

## 2019-10-23 IMAGING — DX DG CHEST 2V
2 series · 2 of 2 positions shown · non-contrast
Comparison: 10/10/2008

CLINICAL DATA: Chest pain right-sided neck pain

EXAM:
CHEST - 2 VIEW

[chest pa]
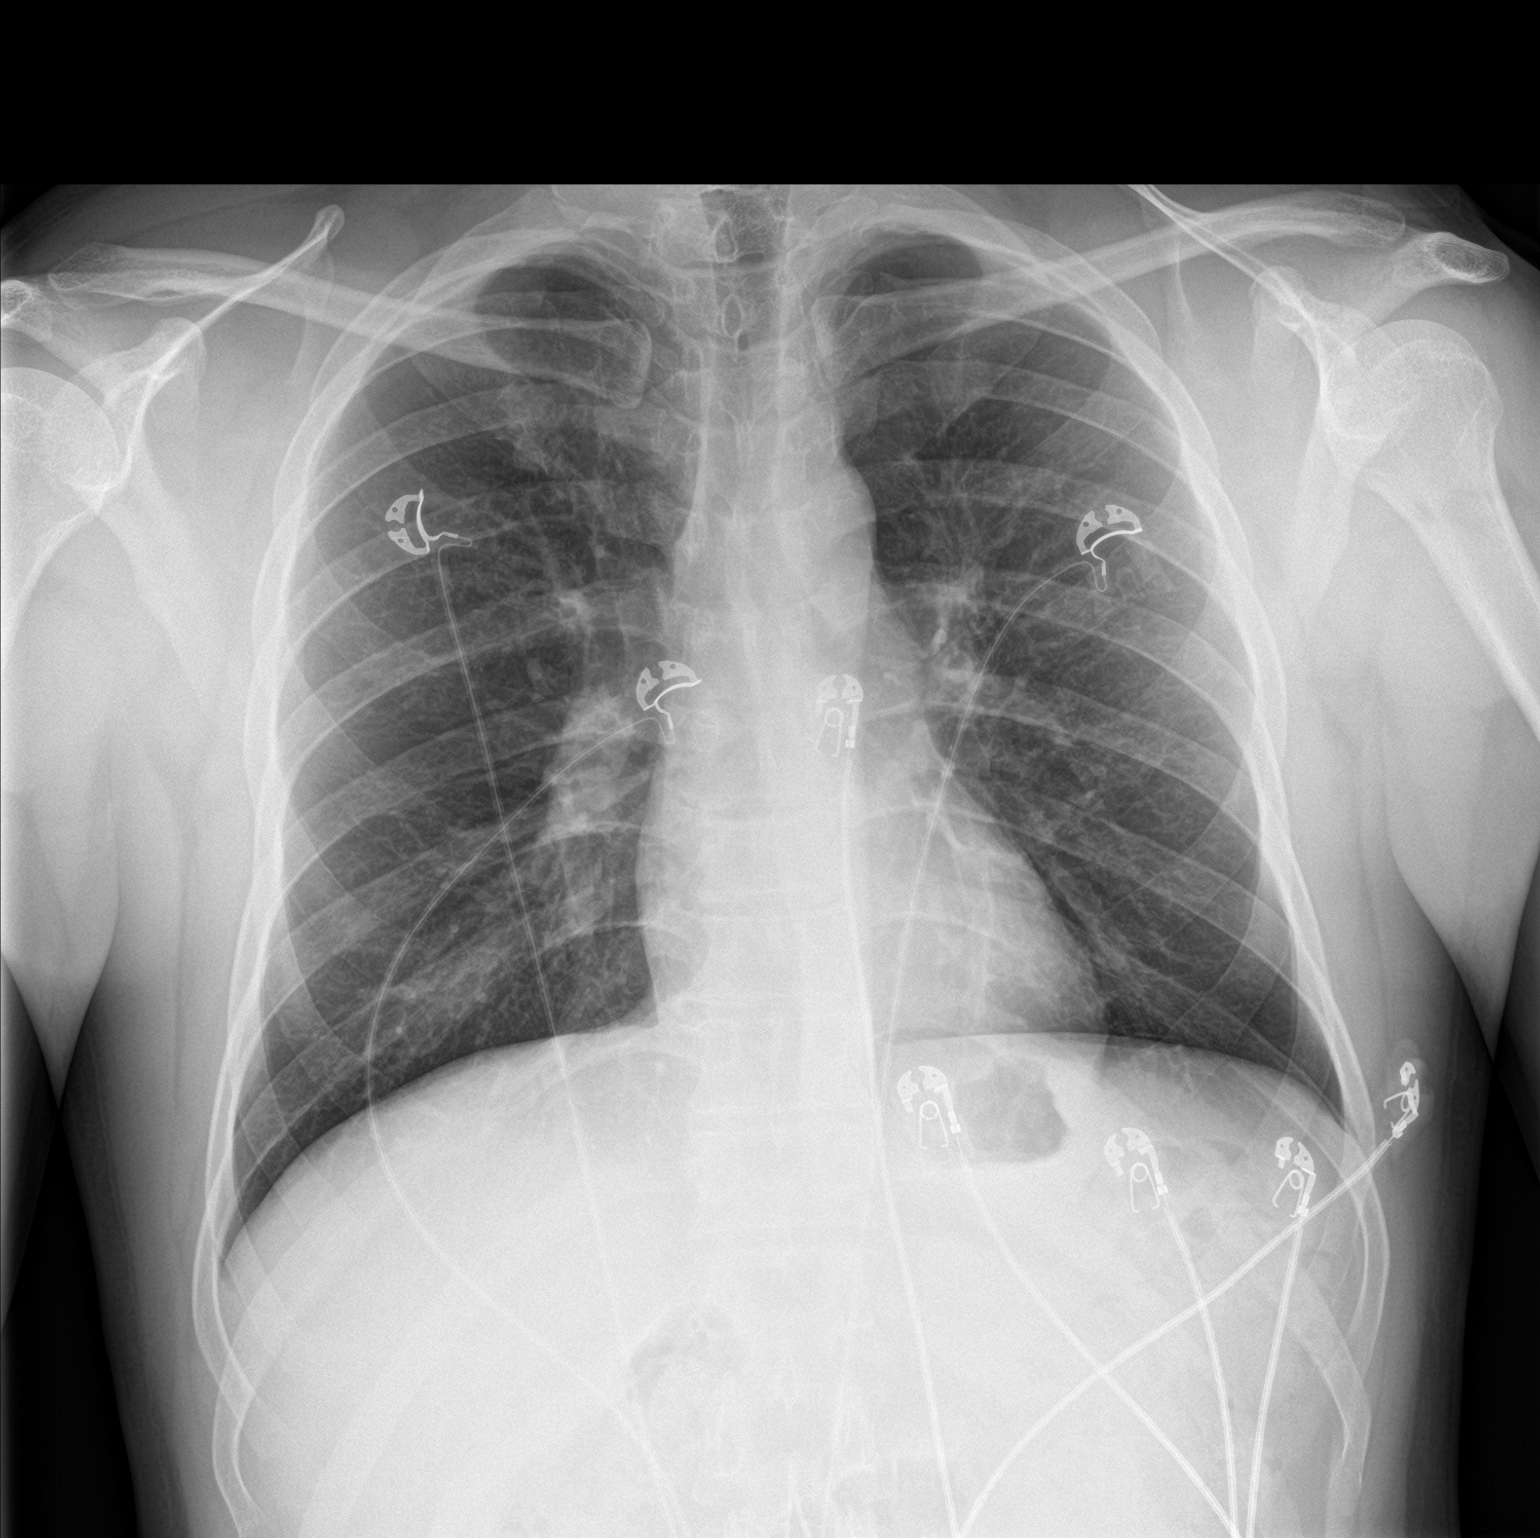

[chest lat]
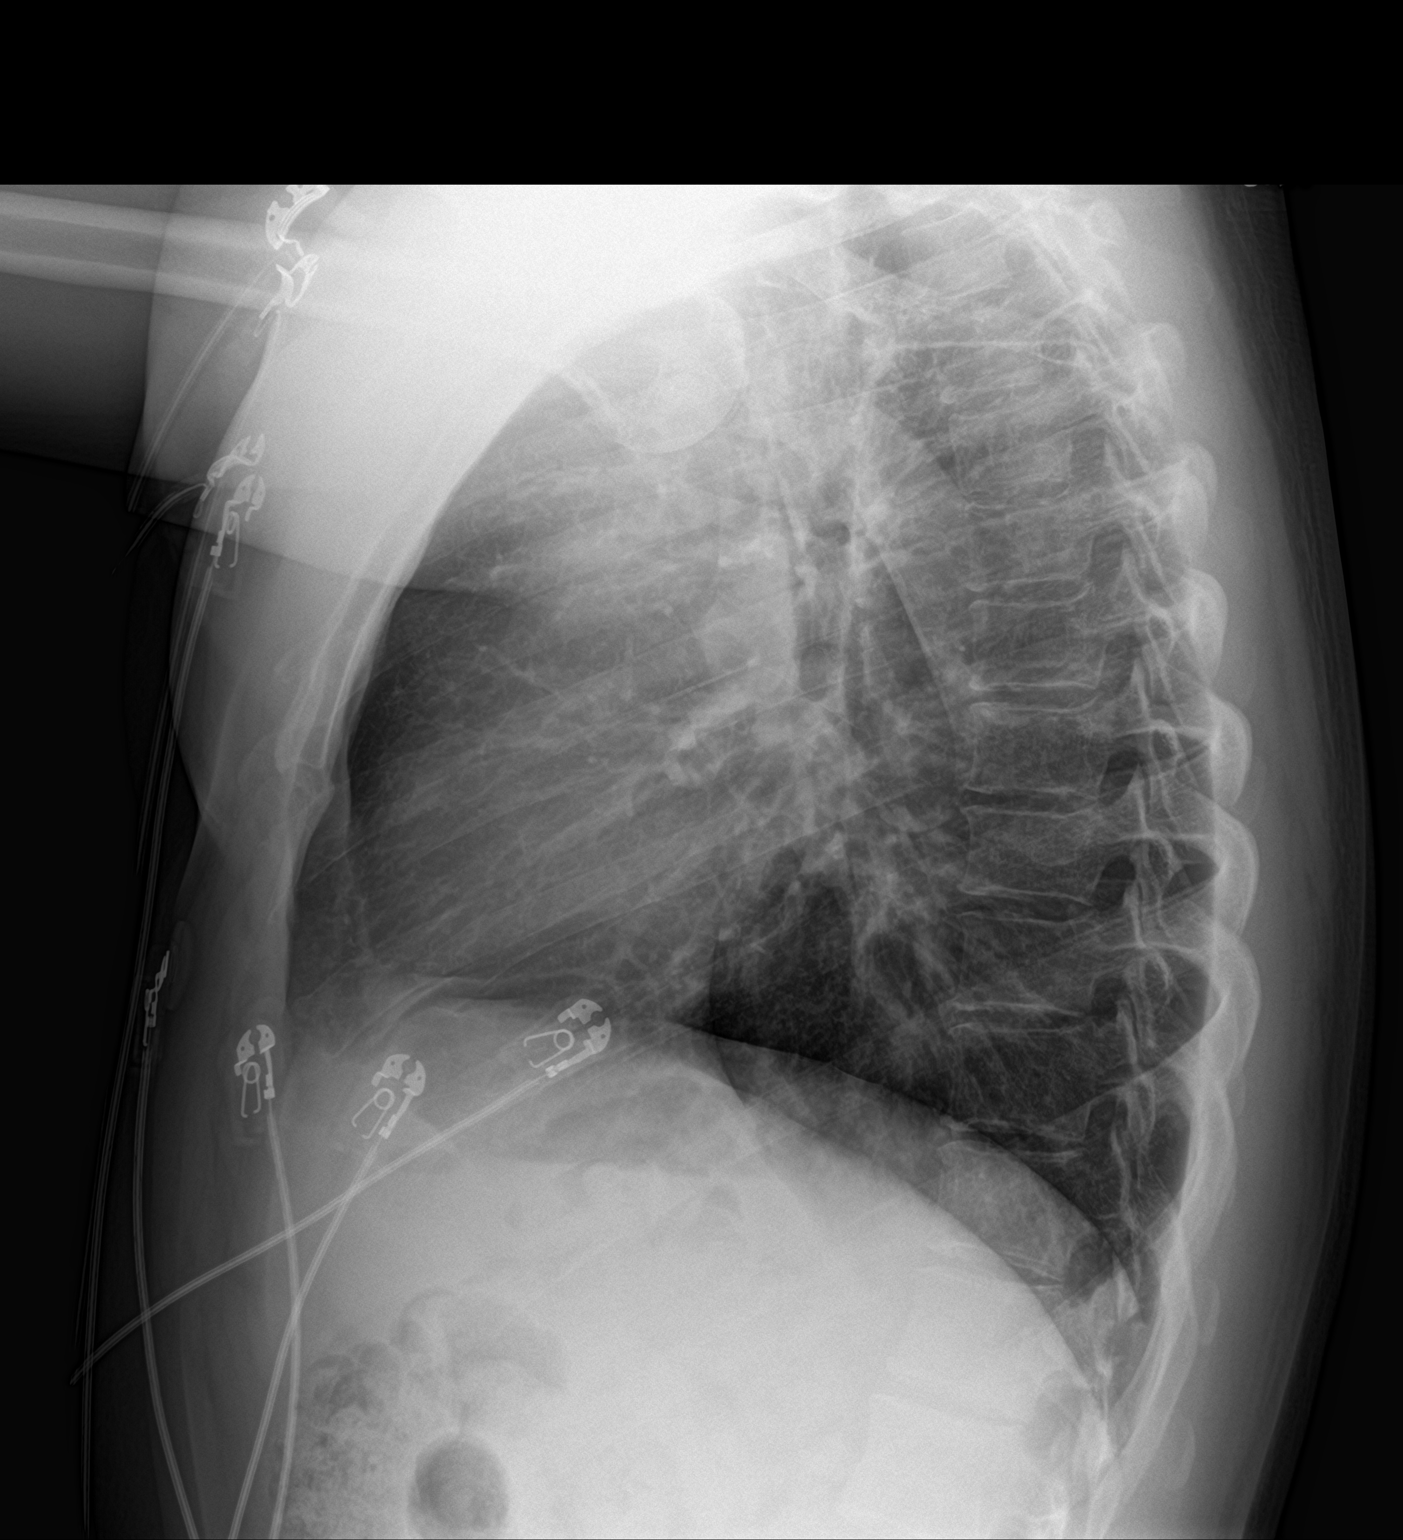

[2 of 2 positions shown; findings below may reference images not displayed]

FINDINGS: The heart size and mediastinal contours are within normal limits.
Both lungs are clear. The visualized skeletal structures are
unremarkable.
IMPRESSION: No active cardiopulmonary disease.

## 2019-11-24 ENCOUNTER — Emergency Department (HOSPITAL_COMMUNITY): Payer: Medicaid Other

## 2019-11-24 ENCOUNTER — Emergency Department (HOSPITAL_COMMUNITY)
Admission: EM | Admit: 2019-11-24 | Discharge: 2019-11-24 | Disposition: A | Discharge status: Home / Self Care | Payer: Medicaid Other | Attending: Emergency Medicine | Admitting: Emergency Medicine

## 2019-11-24 ENCOUNTER — Other Ambulatory Visit: Payer: Self-pay

## 2019-11-24 ENCOUNTER — Encounter (HOSPITAL_COMMUNITY): Payer: Self-pay | Admitting: Emergency Medicine

## 2019-11-24 DIAGNOSIS — J45901 Unspecified asthma with (acute) exacerbation: Secondary | ICD-10-CM | POA: Diagnosis not present

## 2019-11-24 DIAGNOSIS — Z20822 Contact with and (suspected) exposure to covid-19: Secondary | ICD-10-CM | POA: Diagnosis not present

## 2019-11-24 DIAGNOSIS — R0602 Shortness of breath: Secondary | ICD-10-CM | POA: Diagnosis present

## 2019-11-24 DIAGNOSIS — Z7951 Long term (current) use of inhaled steroids: Secondary | ICD-10-CM | POA: Diagnosis not present

## 2019-11-24 LAB — BASIC METABOLIC PANEL
Anion gap: 10 (ref 5–15)
BUN: 12 mg/dL (ref 6–20)
CO2: 23 mmol/L (ref 22–32)
Calcium: 8.7 mg/dL — ABNORMAL LOW (ref 8.9–10.3)
Chloride: 105 mmol/L (ref 98–111)
Creatinine, Ser: 1.03 mg/dL (ref 0.61–1.24)
GFR calc Af Amer: >60 mL/min (ref >60)
GFR calc non Af Amer: >60 mL/min (ref >60)
Glucose, Bld: 184 mg/dL — ABNORMAL HIGH (ref 70–99)
Potassium: 2.8 mmol/L — ABNORMAL LOW (ref 3.5–5.1)
Sodium: 138 mmol/L (ref 135–145)

## 2019-11-24 LAB — SARS CORONAVIRUS 2 BY RT PCR (HOSPITAL ORDER, PERFORMED IN ~~LOC~~ HOSPITAL LAB): SARS Coronavirus 2: NEGATIVE

## 2019-11-24 LAB — CBC
HCT: 42.7 % (ref 39.0–52.0)
Hemoglobin: 13.6 g/dL (ref 13.0–17.0)
MCH: 26.7 pg (ref 26.0–34.0)
MCHC: 31.9 g/dL (ref 30.0–36.0)
MCV: 83.9 fL (ref 80.0–100.0)
Platelets: 157 10*3/uL (ref 150–400)
RBC: 5.09 MIL/uL (ref 4.22–5.81)
RDW: 14.4 % (ref 11.5–15.5)
WBC: 6.9 10*3/uL (ref 4.0–10.5)
nRBC: 0 % (ref 0.0–0.2)

## 2019-11-24 MED ORDER — ALBUTEROL SULFATE (2.5 MG/3ML) 0.083% IN NEBU: 5.0000 mg | INHALATION_SOLUTION | Freq: Once | RESPIRATORY_TRACT | Status: DC

## 2019-11-24 MED ORDER — DEXAMETHASONE 2 MG PO TABS: 10.0000 mg | ORAL_TABLET | Freq: Every day | ORAL | 0 refills | Status: AC

## 2019-11-24 MED ORDER — ALBUTEROL SULFATE HFA 108 (90 BASE) MCG/ACT IN AERS
8.0000 | INHALATION_SPRAY | Freq: Once | RESPIRATORY_TRACT | Status: AC
  Administered 2019-11-24: 8 via RESPIRATORY_TRACT
  Filled 2019-11-24: qty 6.7

## 2019-11-24 MED ORDER — IPRATROPIUM BROMIDE HFA 17 MCG/ACT IN AERS
2.0000 | INHALATION_SPRAY | Freq: Once | RESPIRATORY_TRACT | Status: AC
  Administered 2019-11-24: 2 via RESPIRATORY_TRACT
  Filled 2019-11-24: qty 12.9

## 2019-11-24 MED ORDER — ALBUTEROL (5 MG/ML) CONTINUOUS INHALATION SOLN: 10.0000 mg/h | INHALATION_SOLUTION | Freq: Once | RESPIRATORY_TRACT | Status: DC

## 2019-11-24 NOTE — ED Triage Notes (Signed)
Pt came in via EMS with c/o asthma exacerbation. He has been struggling all day and treating himself with his inhaler and nebs. It has progressively gotten worse. He had three duo nebs via EMS, 125 of solu-medrol and 2g magnesium sulfate. Pt using accessory muscles. Current O2 96% and HR 116

## 2019-11-24 NOTE — ED Provider Notes (Signed)
[facility]-EMERGENCY DEPT Provider Note   CSN: [id number] Arrival date & time: 11/24/19  0106     History Chief Complaint  Patient presents with  . Shortness of Breath    Ralph Robinson is a 44 y.o. male presenting for evaluation of shortness of breath.  Patient states this morning he was around cut grass.  This is frequently a trigger for his asthma.  He started to develop shortness of breath, chest tightness, wheezing.  He states he used multiple rounds of his home albuterol without improvement.  He has needed to be admitted for his asthma before, has never been intubated for this.  He takes Symbicort daily, he did take it today.  He denies fevers, chills, chest pain, nausea, vomiting, abdominal pain.  He denies recent travel, surgeries, immobilization, history of cancer, history previous DVT/PE, or hormone use.  Additional history obtained from EMS/triage note.  On EMS arrival, patient had obvious respiratory distress.  He received 3 duo nebs with EMS, 125 mg of Solu-Medrol, 2 g of mag.  HPI     Past Medical History:  Diagnosis Date  . Asthma   . Paresthesia of both feet   . 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 Vertigo     Patient Active Problem List   Diagnosis Date Noted  . 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 Muscle ache 04/12/2011    History reviewed. No pertinent surgical history.     History reviewed. No pertinent family history.  Social History   Tobacco Use  . Smoking status: Never Smoker  . Smokeless tobacco: Never Used  Substance Use Topics  . Alcohol use: No  . Drug use: No    Home Medications Prior to Admission medications   Medication Sig Start Date End Date Taking? Authorizing Provider  albuterol (ACCUNEB) 0.63 MG/3ML nebulizer solution Take 1 ampule by nebulization 4 (four) times daily. 09/15/19  Yes [provider]  busPIRone (BUSPAR) 5 MG tablet Take 10 mg by mouth 2 (two) times daily.  10/10/19  Yes [provider]  NUCALA 100 MG/ML SOAJ Inject 1 Syringe into the skin every 30 (thirty) days. 10/09/19  Yes [provider]  SPIRIVA HANDIHALER 18 MCG inhalation capsule Place 1 capsule into inhaler and inhale daily. 08/21/19  Yes [provider]  triamcinolone ointment (KENALOG) 0.1 % Apply 1 application topically 2 (two) times daily. 09/10/19  Yes [provider]  albuterol (PROVENTIL HFA;VENTOLIN HFA) 108 (90 BASE) MCG/ACT inhaler Inhale 2 puffs into the lungs every 6 (six) hours as needed.      [provider]  budesonide-formoterol (SYMBICORT) 160-4.5 MCG/ACT inhaler Inhale 2 puffs into the lungs 2 (two) times daily.    [provider]  dexamethasone (DECADRON) 2 MG tablet Take 5 tablets (10 mg total) by mouth daily for 2 days. 11/24/19 11/26/19  Ottavio Norem, PA-C  montelukast (SINGULAIR) 10 MG tablet Take 10 mg by mouth at bedtime.    [provider]    Allergies    Prednisone  Review of Systems   Review of Systems  Respiratory: Positive for shortness of breath and wheezing.   All other systems reviewed and are negative.   Physical Exam Updated Vital Signs BP 123/74 (BP Location: Right Arm)   Pulse (!) 104   Temp 98.6 F (37 C) (Oral)   Resp 18   SpO2 92%   Physical Exam Vitals and nursing note reviewed.  Constitutional:  General: He is not in acute distress.    Appearance: He is well-developed.     Comments: Appears short of breath, otherwise nontoxic  HENT:     Head: Normocephalic and atraumatic.  Eyes:     Extraocular Movements: Extraocular movements intact.     Conjunctiva/sclera: Conjunctivae normal.     Pupils: Pupils are equal, round, and reactive to light.  Cardiovascular:     Rate and Rhythm: Regular rhythm. Tachycardia present.     Pulses: Normal pulses.     Comments: Tachycardic 120 Pulmonary:     Effort: Pulmonary effort is normal. Tachypnea present.     Breath sounds:  Wheezing present.     Comments: Expiratory wheezing in all fields.  Patient tachypneic.  Mild accessory muscle use.  No tripoding.  Sats 91-92% on RA Abdominal:     General: Bowel sounds are normal. There is no distension.     Palpations: Abdomen is soft.     Tenderness: There is no abdominal tenderness.  Musculoskeletal:        General: Normal range of motion.     Cervical back: Normal range of motion and neck supple.     Right lower leg: No edema.     Left lower leg: No edema.  Skin:    General: Skin is warm and dry.     Capillary Refill: Capillary refill takes less than 2 seconds.  Neurological:     Mental Status: He is alert and oriented to person, place, and time.     ED Results / Procedures / Treatments   Labs (all labs ordered are listed, but only abnormal results are displayed) Labs Reviewed  BASIC METABOLIC PANEL - Abnormal; Notable for the following components:      Result Value   Potassium 2.8 (*)    Glucose, Bld 184 (*)    Calcium 8.7 (*)    All other components within normal limits  SARS CORONAVIRUS 2 BY RT PCR [facility] ORDER, PERFORMED IN [facility])  CBC    EKG EKG Interpretation  Date/Time:  Sunday November 24 2019 01:24:00 EDT Ventricular Rate:  120 PR Interval:    QRS Duration: 90 QT Interval:  336 QTC Calculation: 477 R Axis:   83 Text Interpretation: Sinus tachycardia RSR' in V1 or V2, probably normal variant Borderline prolonged QT interval No significant change since 11/03/2017 Confirmed by Geoffery Lyons ([id number]) on 11/24/2019 1:41:12 AM   Radiology DG Chest Port 1 View  Result Date: 11/24/2019 CLINICAL DATA:  Asthma exacerbation EXAM: PORTABLE CHEST 1 VIEW COMPARISON:  Radiograph 02/05/2019 FINDINGS: Mild airways thickening. No consolidation, features of edema, pneumothorax, or effusion. Pulmonary vascularity is normally distributed. The cardiomediastinal contours are unremarkable. No acute osseous or soft tissue abnormality.  IMPRESSION: Some mild airways thickening could reflect reactive airways disease. No other acute cardiopulmonary abnormality. Electronically Signed   By: Kreg Shropshire M.D.   On: 11/24/2019 02:21    Procedures Procedures (including critical care time)  Medications Ordered in ED Medications  albuterol (VENTOLIN HFA) 108 (90 Base) MCG/ACT inhaler 8 puff (8 puffs Inhalation Given 11/24/19 0201)  ipratropium (ATROVENT HFA) inhaler 2 puff (2 puffs Inhalation Given 11/24/19 0201)    ED Course  I have reviewed the triage vital signs and the nursing notes.  Pertinent labs & imaging results that were available during my care of the patient were reviewed by me and considered in my medical decision making (see chart for details).    MDM Rules/Calculators/A&P  Patient presenting for evaluation of shortness of breath.  On exam, patient with increased work of breathing.  Wheezing in all fields.  He states this is typical for his asthma exacerbation.  He has no PE risk factors.  He reports improvement with EMS medications, however he is still feeling very short of breath.  Ideally would give a continuous neb, however currently patient needs to have a negative Covid test prior to starting this treatment.  As such, given 8 puffs of the albuterol inhaler and 2 puffs of ipratropium.  As he may need admission, will obtain x-ray and basic labs.  Covid test ordered, but I have low suspicion for infectious etiology.  Labs show mild hypokalemia at 2.8, likely due to albuterol.  Chest x-ray viewed interpreted by me, no pneumonia, pnx, effusion, cardiomegaly.  Covid test negative.  On reassessment, patient is asleep.  He states he feels much better.  Sats in the upper 90s.  He is no longer tachycardic.  Will ambulate to ensure no hypoxia.  Patient ambulated without difficulty or significant shortness of breath or work of breathing.  Sats stayed above 92%. discussed with pt, he feels safe for  d/c. At this time, pt appears safe for d/c. Return precautions given. Pt states he understands and agrees to plan.   Ralph Robinson was evaluated in Emergency Department on 11/24/2019 for the symptoms described in the history of present illness. He was evaluated in the context of the global COVID-19 pandemic, which necessitated consideration that the patient might be at risk for infection with the SARS-CoV-2 virus that causes COVID-19. Institutional protocols and algorithms that pertain to the evaluation of patients at risk for COVID-19 are in a state of rapid change based on information released by regulatory bodies including the CDC and federal and state organizations. These policies and algorithms were followed during the patient's care in the ED.  Final Clinical Impression(s) / ED Diagnoses Final diagnoses:  Exacerbation of asthma, unspecified asthma severity, unspecified whether persistent    Rx / DC Orders ED Discharge Orders         Ordered    dexamethasone (DECADRON) 2 MG tablet  Daily        11/24/19 0347           Alveria Apley, PA-C 11/24/19 4540    Geoffery Lyons, MD 11/24/19 [phone]

## 2019-11-24 NOTE — ED Notes (Signed)
Pt ambulated with pulse oximetry. O2 maintained at 92 to 95%. Pt does not appear to be in any distress upon ambulation

## 2019-11-24 NOTE — Discharge Instructions (Signed)
Take Decadron for the next 2 days as prescribed. Use your inhaler every 4 hours while awake for the next 2 days.  After this, use as needed for shortness of breath, wheezing, chest tightness. Follow-up with your pulmonologist for recheck of your symptoms. Return to the emergency room if you develop chest pain, increased difficulty breathing despite medications, or any new, worsening, or concerning symptoms.

## 2019-12-23 ENCOUNTER — Ambulatory Visit (INDEPENDENT_AMBULATORY_CARE_PROVIDER_SITE_OTHER): Payer: Medicaid Other | Admitting: Clinical

## 2019-12-23 ENCOUNTER — Other Ambulatory Visit: Payer: Self-pay

## 2019-12-23 DIAGNOSIS — F330 Major depressive disorder, recurrent, mild: Secondary | ICD-10-CM

## 2019-12-23 DIAGNOSIS — F410 Panic disorder [episodic paroxysmal anxiety] without agoraphobia: Secondary | ICD-10-CM | POA: Diagnosis not present

## 2019-12-23 DIAGNOSIS — F411 Generalized anxiety disorder: Secondary | ICD-10-CM

## 2019-12-23 NOTE — Progress Notes (Signed)
Comprehensive Clinical Assessment (CCA) Note  12/23/2019 Ralph Robinson [id number]  Visit Diagnosis:      ICD-10-CM   1. Generalized anxiety disorder with panic attacks  F41.1    F41.0   2. Major depressive disorder, recurrent episode, mild (HCC)  F33.0         CCA Biopsychosocial  Intake/Chief Complaint:  CCA Intake With Chief Complaint CCA Part Two Date: 12/23/19 CCA Part Two Time: 1100 Chief Complaint/Presenting Problem: Client is referred by his primary care doctor through [facility] due to concern of anxiety and depression that has affected his ability to perform daily tasks and work. Patient's Currently Reported Symptoms/Problems: anxiety, depression, dizziness, increased heart rate, inability to work [employer] Preferences: Client stated, "I want to become better and start working". Type of Services Patient Feels Are Needed: Individual therapy, psychiatric evaluation and medication management  Mental Health Symptoms Depression:  Depression: Change in energy/activity, Difficulty Concentrating, Hopelessness, Duration of symptoms greater than two weeks  Mania:  Mania: None  Anxiety:   Anxiety: Difficulty concentrating, Tension, Worrying  Psychosis:  Psychosis: None  Trauma:  Trauma: None  Obsessions:  Obsessions: None  Compulsions:  Compulsions: None  Inattention:  Inattention: None  Hyperactivity/Impulsivity:  Hyperactivity/Impulsivity: N/A  Oppositional/Defiant Behaviors:  Oppositional/Defiant Behaviors: None  Emotional Irregularity:  Emotional Irregularity: None  Other Mood/Personality Symptoms:      Mental Status Exam Appearance and self-care  Stature:  Stature: Average  Weight:  Weight: Average weight  Clothing:  Clothing: Casual  Grooming:  Grooming: Normal  Cosmetic use:  Cosmetic Use: Age appropriate  Posture/gait:  Posture/Gait: Normal  Motor activity:  Motor Activity: Not Remarkable  Sensorium  Attention:  Attention: Normal  Concentration:   Concentration: Normal  Orientation:  Orientation: X5  Recall/memory:  Recall/Memory: Normal  Affect and Mood  Affect:  Affect: Congruent  Mood:  Mood: Anxious  Relating  Eye contact:  Eye Contact: Normal  Facial expression:  Facial Expression: Anxious  Attitude toward examiner:  Attitude Toward Examiner: Cooperative  Thought and Language  Speech flow: Speech Flow: Clear and Coherent  Thought content:  Thought Content: Appropriate to Mood and Circumstances  Preoccupation:  Preoccupations: None  Hallucinations:  Hallucinations: None  Organization:     Company secretary of Knowledge:  Fund of Knowledge: Good  Intelligence:  Intelligence: Average  Abstraction:  Abstraction: Normal  Judgement:  Judgement: Good  Reality Testing:  Reality Testing: Adequate  Insight:  Insight: Good  Decision Making:  Decision Making: Normal  Social Functioning  Social Maturity:  Social Maturity: Responsible  Social Judgement:  Social Judgement: Normal  Stress  Stressors:  Stressors: Transitions, Work  Coping Ability:  Coping Ability: Building surveyor Deficits:  Skill Deficits: Activities of daily living  Supports:  Supports: Family (wife)     Religion: Religion/Spirituality Are You A Religious Person?: No  Leisure/Recreation: Leisure / Recreation Do You Have Hobbies?: Yes 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  Education: Education  Did You Graduate From [employer]?: Yes   CCA Family/Childhood History  Family and Relationship History: Family history Marital status: Married Does patient have children?: Yes How is patient's relationship with their children?: 62 and 75 year old children, have a good relationship.  Childhood History:  Childhood History Additional childhood history information: Client reported his mother passed when he was 37 years old. Client reported his childhood was not the best living with his father and step mother. Description of patient's relationship with caregiver when they were a child: Client reported his step mother was "stressful" to deal with and would "discriminate" between him and other children. Client reported he tried to stay away from home as often as possible. Does patient have siblings?: Yes Description of patient's current relationship with siblings: one younger sister, has a good relationship with his sister. Did patient suffer any verbal/emotional/physical/sexual abuse as a child?: No Did patient suffer from severe childhood neglect?: No Has patient ever been sexually abused/assaulted/raped as an adolescent or adult?: No Was the patient ever a victim of a crime or a disaster?: No Witnessed domestic violence?: No Has patient been affected by domestic violence as an adult?: No  Child/Adolescent Assessment:     CCA Substance Use  Alcohol/Drug Use: Alcohol / Drug Use History of alcohol / drug use?: No history of alcohol / drug abuse                         ASAM's:  Six Dimensions of Multidimensional Assessment  Dimension 1:  Acute Intoxication and/or Withdrawal Potential:      Dimension 2:  Biomedical Conditions and Complications:      Dimension 3:  Emotional, Behavioral, or Cognitive Conditions and Complications:     Dimension 4:  Readiness to Change:     Dimension 5:  Relapse, Continued use, or Continued  Problem Potential:     Dimension 6:  Recovery/Living Environment:     ASAM Severity Score:    ASAM Recommended Level of Treatment:     Substance use Disorder (SUD)    Recommendations for Services/Supports/Treatments: Recommendations for Services/Supports/Treatments Recommendations For Services/Supports/Treatments: Medication Management, Individual Therapy  DSM5 Diagnoses: Patient Active Problem List   Diagnosis Date Noted  . Generalized anxiety disorder with panic attacks 12/23/2019  . 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 Muscle ache 04/12/2011    Patient Centered Plan: Patient is on the following Treatment Plan(s):  Anxiety and Depression   Client is a 44 year old male. Client is referred by his primary care with [facility] for behavioral health services. Client states mental health symptoms as evidenced by edginess, increased heart rate, sadness, inability to work, shakiness. Client denies suicidal and homicidal ideations at this time. Client denies hallucinations and delusions at this time. Client reported no substance use.  Client was screened for the following SDOH:  GAD 7 : Generalized Anxiety Score 12/23/2019  Nervous, Anxious, on Edge 3  Control/stop worrying 3  Worry too much - different things 3  Trouble relaxing 2  Restless 2  Easily annoyed or irritable 3  Afraid - awful might happen 3  Total GAD 7 Score 19  Anxiety Difficulty Extremely difficult     Counselor from 12/23/2019 in [facility]  PHQ-9 Total Score 8       Client meets criteria for GENERALIZED ANXIETY DISORDER W/ PANIC ATTACKS evidenced by the clients report of excessive anxiety and worry more days than not for six months or  more, difficulty controlling the worry, difficulty concentrating, muscle tension, feeling on edge, significant impairment in occupational functioning, accelerated heart rate, shaking, shortness of breath, and feeling dizzy.   Client reported he did not know it at the time, but he has probably had anxiety since 2018. 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 Client stated, "My heart rate gets high, the road feels tilted towards the left, and I feel like I'm falling towards the left". Client reported he has been unable to work for the past 2 years because of his symptoms. Client reported the anxiety triggers his asthma and it makes it hard for him to do daily activities. Client reported no traumatic events that might allude to the cause for his anxiety. Client reported his PCP has prescribed him something which he feels has mildly helped his symptoms. Client meets criteria for MAJOR DEPRESSIVE DISORDER, RECURRENT EPISODE, MILD evidenced by the clients report of little pleasure in doing things, feeling down, inappropriate guilt, and difficulty concentrating.  Client reported his symptoms have primarily stemmed from his inability to work due to his uncontrollable anxiety. Client reported one hospitalization in 2017 for suicidal ideations but has not had any other inpatient or outpatient treatments for depression and/ or anxiety since then.    Treatment recommendations are psychiatric evaluation with medication management and individual therapy.    Clinician provided information on format of appointment (virtual or face to face).  Client was in agreement with treatment recommendations.    Referrals to Alternative Service(s): Referred to Alternative Service(s):   Place:   Date:   Time:    Referred to Alternative Service(s):   Place:   Date:   Time:    Referred to Alternative Service(s):   Place:   Date:   Time:    Referred to Alternative Service(s):   Place:   Date:   Time:     Loree Fee

## 2020-01-06 ENCOUNTER — Ambulatory Visit (INDEPENDENT_AMBULATORY_CARE_PROVIDER_SITE_OTHER): Payer: Medicaid Other | Admitting: Psychiatry

## 2020-01-06 ENCOUNTER — Encounter (HOSPITAL_COMMUNITY): Payer: Self-pay | Admitting: Psychiatry

## 2020-01-06 ENCOUNTER — Other Ambulatory Visit: Payer: Self-pay

## 2020-01-06 VITALS — BP 112/75 | HR 73 | Ht 66.0 in | Wt 175.5 lb

## 2020-01-06 DIAGNOSIS — F330 Major depressive disorder, recurrent, mild: Secondary | ICD-10-CM

## 2020-01-06 DIAGNOSIS — F410 Panic disorder [episodic paroxysmal anxiety] without agoraphobia: Secondary | ICD-10-CM

## 2020-01-06 DIAGNOSIS — F411 Generalized anxiety disorder: Secondary | ICD-10-CM

## 2020-01-06 MED ORDER — HYDROXYZINE HCL 10 MG PO TABS: 10.0000 mg | ORAL_TABLET | Freq: Three times a day (TID) | ORAL | 1 refills | Status: DC | PRN

## 2020-01-06 MED ORDER — SERTRALINE HCL 50 MG PO TABS: 50.0000 mg | ORAL_TABLET | Freq: Every day | ORAL | 1 refills | Status: DC
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
## 2020-02-19 ENCOUNTER — Ambulatory Visit (HOSPITAL_COMMUNITY): Payer: Medicaid Other | Admitting: Clinical

## 2020-03-03 ENCOUNTER — Ambulatory Visit (HOSPITAL_COMMUNITY): Payer: Medicaid Other | Admitting: Psychiatry

## 2020-03-04 ENCOUNTER — Ambulatory Visit (HOSPITAL_COMMUNITY): Payer: Self-pay | Admitting: Clinical

## 2020-05-25 ENCOUNTER — Ambulatory Visit: Payer: Medicaid Other | Attending: Family Medicine | Admitting: Physical Therapy

## 2020-06-04 ENCOUNTER — Ambulatory Visit: Payer: Medicaid Other | Attending: Family Medicine | Admitting: Physical Therapy

## 2020-06-04 ENCOUNTER — Encounter: Payer: Self-pay | Admitting: Physical Therapy

## 2020-06-04 ENCOUNTER — Other Ambulatory Visit: Payer: Self-pay

## 2020-06-04 DIAGNOSIS — R42 Dizziness and giddiness: Secondary | ICD-10-CM | POA: Insufficient documentation

## 2020-06-04 DIAGNOSIS — R2689 Other abnormalities of gait and mobility: Secondary | ICD-10-CM | POA: Diagnosis present

## 2020-06-04 NOTE — Patient Instructions (Signed)
Access Code: [email or web address] URL: [email or web address] Date: 06/04/2020 Prepared by: Lysle Rubens  Exercises Standing Gaze Stabilization with Head Rotation - 1 x daily - 7 x weekly - 3 sets - 10 reps Standing Gaze Stabilization with Head Nod - 1 x daily - 7 x weekly - 3 sets - 10 reps Walking with Head Rotation - 1 x daily - 7 x weekly - 3 sets - 10 reps
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
## 2020-06-11 ENCOUNTER — Ambulatory Visit: Payer: Medicaid Other

## 2020-09-03 ENCOUNTER — Ambulatory Visit (INDEPENDENT_AMBULATORY_CARE_PROVIDER_SITE_OTHER): Payer: Medicaid Other | Admitting: Clinical

## 2020-09-03 DIAGNOSIS — F411 Generalized anxiety disorder: Secondary | ICD-10-CM | POA: Diagnosis not present

## 2020-09-03 DIAGNOSIS — F410 Panic disorder [episodic paroxysmal anxiety] without agoraphobia: Secondary | ICD-10-CM | POA: Diagnosis not present

## 2020-09-03 NOTE — Progress Notes (Signed)
   THERAPIST PROGRESS NOTE Virtual Visit via Video Note  I connected with Ralph Robinson on 09/03/20 at  1:00 PM EDT by a video enabled telemedicine application and verified that I am speaking with the correct person using two identifiers.  Location: Patient: home Provider: home office   I discussed the limitations of evaluation and management by telemedicine and the availability of in person appointments. The patient expressed understanding and agreed to proceed.   Follow Up Instructions: I discussed the assessment and treatment plan with the patient. The patient was provided an opportunity to ask questions and all were answered. The patient agreed with the plan and demonstrated an understanding of the instructions.   The patient was advised to call back or seek an in-person evaluation if the symptoms worsen or if the condition fails to improve as anticipated.   Session Time: 30 minutes  Participation Level: Active  Behavioral Response: CasualAlertEuthymic  Type of Therapy: Individual Therapy  Treatment Goals addressed: Coping  Interventions: CBT and Supportive  Summary:  Ralph Robinson is a 45 y.o. male who presents for the scheduled session oriented times five, appropriately dressed, ad friendly. Client denied hallucinations and delusions. Client reported on today he has been dealing with anxiety and anger. Client reported he also experiences his heart racing when he feels with emotion. Client reported he has a hard time controlling the emotion. Client reported he has been taking Prednisone prescribed by his doctor for a problem he is having with his nose. Client reported that medication is also affecting his irritability. Client reported his triggers are driving for anxiety and conversations with people cause anger and anxiety. Client reported he has to pull over when driving at times because he notices his heart rate increases. Client reported while talking to people he noticed  that if he is not being understood or has to repeat himself he becomes upset. Client reported the miscommunication happens in his language and in [place]. Client reported he sometimes raise his voice when he repeats himself even when he is not mad but the other person takes it offensively. Client reported when he becomes so irritable it makes him want to stay inside and not interact with others. Client reported he is currently looking for employment. Client reported his goal is it working on coping with his anger.    Suicidal/Homicidal: Nowithout intent/plan  Therapist Response:  Therapist began the session checking in asking how he has been doing since last seen. Therapist used active listening and eye contact while he discussed his thoughts and feelings. Therapist used CBT to engage with the client to discuss how his anxiety and anger have negatively impacted his physical health and interpersonal relationships.  Therapist assigned the client homework to practice deep breathing and muscle relaxation exercises. Client was scheduled for next appointment.    Plan: Return again in 5 weeks.  Diagnosis: Generalized anxiety disorder w/ panic attacks   Ralph Rhymes Rani Idler, LCSW 09/03/2020

## 2020-09-04 ENCOUNTER — Telehealth (HOSPITAL_COMMUNITY): Payer: Medicaid Other | Admitting: Physician Assistant

## 2020-10-05 ENCOUNTER — Other Ambulatory Visit (HOSPITAL_COMMUNITY): Payer: Self-pay | Admitting: Psychiatry

## 2020-10-05 DIAGNOSIS — F330 Major depressive disorder, recurrent, mild: Secondary | ICD-10-CM

## 2020-10-05 DIAGNOSIS — F410 Panic disorder [episodic paroxysmal anxiety] without agoraphobia: Secondary | ICD-10-CM

## 2021-01-13 ENCOUNTER — Emergency Department (HOSPITAL_COMMUNITY)
Admission: EM | Admit: 2021-01-13 | Discharge: 2021-01-13 | Discharge status: Against Medical Advice | Payer: Medicaid Other

## 2022-01-11 ENCOUNTER — Ambulatory Visit: Payer: Medicaid Other | Admitting: Physician Assistant

## 2022-01-11 ENCOUNTER — Encounter: Payer: Self-pay | Admitting: Physician Assistant

## 2022-01-11 VITALS — BP 112/92 | HR 86 | Ht 68.0 in

## 2022-01-11 DIAGNOSIS — F410 Panic disorder [episodic paroxysmal anxiety] without agoraphobia: Secondary | ICD-10-CM | POA: Diagnosis not present

## 2022-01-11 DIAGNOSIS — Z720 Tobacco use: Secondary | ICD-10-CM

## 2022-01-11 DIAGNOSIS — F411 Generalized anxiety disorder: Secondary | ICD-10-CM

## 2022-01-11 DIAGNOSIS — F17200 Nicotine dependence, unspecified, uncomplicated: Secondary | ICD-10-CM

## 2022-01-11 DIAGNOSIS — F330 Major depressive disorder, recurrent, mild: Secondary | ICD-10-CM | POA: Diagnosis not present

## 2022-01-11 MED ORDER — FLUOXETINE HCL 20 MG PO CAPS: 20.0000 mg | ORAL_CAPSULE | Freq: Every day | ORAL | 1 refills | Status: DC

## 2022-01-11 MED ORDER — TRAZODONE HCL 50 MG PO TABS: 50.0000 mg | ORAL_TABLET | Freq: Every day | ORAL | 1 refills | Status: DC

## 2022-01-11 NOTE — Patient Instructions (Signed)
You are going to start taking Prozac once daily in the morning.  It may be 3 to 4 weeks before you notice any difference from this medication, it is important just to stay consistent with it.  To help you with sleep, you are going to use trazodone.  You will take this approximately 30 minutes before desired bedtime, goal is to aim for 7 to 8 hours of sleep at night.  I encourage you to increase your water intake, you should be drinking at least 64 ounces of water a day.  I encourage you to start taking vitamin D, you will purchase this over-the-counter, you should take 2000 units once daily.  I encourage you to follow-up with your primary care provider in approximately 4 to 5 weeks, or return to the mobile unit for follow-up.  Kennieth Rad, PA-C Physician Assistant [facility] [email or web address]   Smokeless Tobacco Information, Adult  Tobacco is a leafy plant that contains a chemical called nicotine. Tobacco use is harmful to your health. Nicotine affects the chemicals in your brain, and this can cause you to crave nicotine. These cravings can lead to addiction. Smokeless tobacco is tobacco that you put in your mouth instead of smoking it. It may also be called chewing tobacco or snuff. Smokeless tobacco is made from the leaves of tobacco plants and comes in many forms, such as: Loose, dry leaves. Plugs or twists. Moist pouches. Dissolving lozenges or strips. Chewing, sucking, or holding the tobacco in your mouth causes your mouth to make more saliva. The saliva mixes with the tobacco to make tobacco "juice" that is swallowed or spit out. How can smokeless tobacco use affect me? All forms of tobacco contain many chemicals that can harm every organ in the body. Using smokeless tobacco increases your risk for: Cancer. Tobacco use is one of the leading causes of cancer. Smokeless tobacco is linked to cancer of the mouth, esophagus, and  pancreas. Other long-term health problems, including high blood pressure, heart disease, and stroke. Addiction. Pregnancy complications. Pregnant women who use smokeless tobacco are more likely to have a miscarriage or deliver a baby too early (premature delivery). Mouth and dental problems, such as: Bad breath. Teeth that look yellow or brown. Mouth sores. Cracking and bleeding lips. Gum recession, gum disease, or tooth decay. Lesions on the soft tissues of your mouth (leukoplakia). The benefits of not using smokeless tobacco include: A healthy mind and body. Saving money. You avoid the cost of buying tobacco and the cost of treating illnesses that are caused by tobacco. What actions can I take to quit using tobacco? Ask your health care provider for help to quit using smokeless tobacco. This may involve treatment. These tips may also help you quit: Pick a date to quit. Set a date within the next 2 weeks. This gives you time to prepare. Write down the reasons why you are quitting. Keep this list in places where you will see it often, such as on your bathroom mirror or in your car or wallet. Identify the people, places, things, and activities that make you want to use smokeless tobacco (triggers). Avoid them. Tell your family and friends that you are quitting. Look for a support group in your area. Having support can make quitting easier. Get rid of any tobacco you have and remove any tobacco smells. To do this: Throw away all containers of tobacco at home, at work, and in your car. Throw away any other items that you use  regularly when you chew tobacco. Clean your car and make sure to remove all tobacco-related items. Clean your home, including curtains and carpets. Keep track of how many days have passed since you quit. It may help to cross days off a calendar. Remembering how long and hard you have worked to quit can help you avoid using smokeless tobacco again. Stay positive. Be  prepared for cravings. It is common to slip up when you first quit, so take it one day at a time. Stay busy and take care of your body. Get plenty of exercise. Drink enough water to keep your urine pale yellow. Where to find support Ask your health care provider if there is a local support group for quitting smokeless tobacco and any available online resources, such as: Smoke Free: www.veterans.smokefree.gov Be Tobacco Free: eopquic.com Tobacco Quitline: 1-855-QUIT-VET 314-627-6040). Hotlines, such as 1-800-QUIT-NOW (218)148-8970). Where to find more information You can learn more about the risks of using smokeless tobacco and the benefits of quitting from these sources: American Cancer Society: www.cancer.org National Cancer Institute: www.cancer.gov Centers for Disease Control and Prevention: FootballExhibition.com.br Food and Drug Administration: https://mcknight.com/ Contact a health care provider if you have: Trouble quitting smokeless tobacco use on your own. White or other discolored patches in your mouth. Difficulty swallowing. A change in your voice. Unexplained weight loss. Stomach pain, nausea, or vomiting. Summary Smokeless tobacco contains many different chemicals that are known to cause cancer. Nicotine is an addictive chemical in smokeless tobacco that affects your brain. The benefits of not using smokeless tobacco include having a healthy mind and body and saving money. Tell your family and friends that you are quitting. Having support can make quitting easier. Ask your health care provider for help quitting smokeless tobacco. This may involve treatment. This information is not intended to replace advice given to you by your health care provider. Make sure you discuss any questions you have with your health care provider. Document Revised: 12/16/2020 Document Reviewed: 12/16/2020 Elsevier Patient Education  2023 Elsevier Inc.  Managing Anxiety, Adult After  being diagnosed with anxiety, you may be relieved to know why you have felt or behaved a certain way. You may also feel overwhelmed about the treatment ahead and what it will mean for your life. With care and support, you can manage this condition. How to manage lifestyle changes Managing stress and anxiety  Stress is your body's reaction to life changes and events, both good and bad. Most stress will last just a few hours, but stress can be ongoing and can lead to more than just stress. Although stress can play a major role in anxiety, it is not the same as anxiety. Stress is usually caused by something external, such as a deadline, test, or competition. Stress normally passes after the triggering event has ended.  Anxiety is caused by something internal, such as imagining a terrible outcome or worrying that something will go wrong that will devastate you. Anxiety often does not go away even after the triggering event is over, and it can become long-term (chronic) worry. It is important to understand the differences between stress and anxiety and to manage your stress effectively so that it does not lead to an anxious response. Talk with your health care provider or a counselor to learn more about reducing anxiety and stress. He or she may suggest tension reduction techniques, such as: Music therapy. Spend time creating or listening to music that you enjoy and that inspires you. Mindfulness-based meditation. Practice being aware  of your normal breaths while not trying to control your breathing. It can be done while sitting or walking. Centering prayer. This involves focusing on a word, phrase, or sacred image that means something to you and brings you peace. Deep breathing. To do this, expand your stomach and inhale slowly through your nose. Hold your breath for 3-5 seconds. Then exhale slowly, letting your stomach muscles relax. Self-talk. Learn to notice and identify thought patterns that lead to  anxiety reactions and change those patterns to thoughts that feel peaceful. Muscle relaxation. Taking time to tense muscles and then relax them. Choose a tension reduction technique that fits your lifestyle and personality. These techniques take time and practice. Set aside 5-15 minutes a day to do them. Therapists can offer counseling and training in these techniques. The training to help with anxiety may be covered by some insurance plans. Other things you can do to manage stress and anxiety include: Keeping a stress diary. This can help you learn what triggers your reaction and then learn ways to manage your response. Thinking about how you react to certain situations. You may not be able to control everything, but you can control your response. Making time for activities that help you relax and not feeling guilty about spending your time in this way. Doing visual imagery. This involves imagining or creating mental pictures to help you relax. Practicing yoga. Through yoga poses, you can lower tension and promote relaxation.  Medicines Medicines can help ease symptoms. Medicines for anxiety include: Antidepressant medicines. These are usually prescribed for long-term daily control. Anti-anxiety medicines. These may be added in severe cases, especially when panic attacks occur. Medicines will be prescribed by a health care provider. When used together, medicines, psychotherapy, and tension reduction techniques may be the most effective treatment. Relationships Relationships can play a big part in helping you recover. Try to spend more time connecting with trusted friends and family members. Consider going to couples counseling if you have a partner, taking family education classes, or going to family therapy. Therapy can help you and others better understand your condition. How to recognize changes in your anxiety Everyone responds differently to treatment for anxiety. Recovery from anxiety  happens when symptoms decrease and stop interfering with your daily activities at home or work. This may mean that you will start to: Have better concentration and focus. Worry will interfere less in your daily thinking. Sleep better. Be less irritable. Have more energy. Have improved memory. It is also important to recognize when your condition is getting worse. Contact your health care provider if your symptoms interfere with home or work and you feel like your condition is not improving. Follow these instructions at home: Activity Exercise. Adults should do the following: Exercise for at least 150 minutes each week. The exercise should increase your heart rate and make you sweat (moderate-intensity exercise). Strengthening exercises at least twice a week. Get the right amount and quality of sleep. Most adults need 7-9 hours of sleep each night. Lifestyle  Eat a healthy diet that includes plenty of vegetables, fruits, whole grains, low-fat dairy products, and lean protein. Do not eat a lot of foods that are high in fats, added sugars, or salt (sodium). Make choices that simplify your life. Do not use any products that contain nicotine or tobacco. These products include cigarettes, chewing tobacco, and vaping devices, such as e-cigarettes. If you need help quitting, ask your health care provider. Avoid caffeine, alcohol, and certain over-the-counter cold medicines. These  may make you feel worse. Ask your pharmacist which medicines to avoid. General instructions Take over-the-counter and prescription medicines only as told by your health care provider. Keep all follow-up visits. This is important. Where to find support You can get help and support from these sources: Self-help groups. Online and [employer]. A trusted spiritual leader. Couples counseling. Family education classes. Family therapy. Where to find more information You may find that joining a support group  helps you deal with your anxiety. The following sources can help you locate counselors or support groups near you: Mental Health America: www.mentalhealthamerica.net Anxiety and Depression Association of [place] (ADAA): ProgramCam.de The [employer] on Mental Illness (NAMI): www.nami.org Contact a health care provider if: You have a hard time staying focused or finishing daily tasks. You spend many hours a day feeling worried about everyday life. You become exhausted by worry. You start to have headaches or frequently feel tense. You develop chronic nausea or diarrhea. Get help right away if: You have a racing heart and shortness of breath. You have thoughts of hurting yourself or others. If you ever feel like you may hurt yourself or others, or have thoughts about taking your own life, get help right away. Go to your nearest emergency department or: Call your local emergency services (911 in the [place] Call a suicide crisis helpline, such as the National Suicide Prevention Lifeline at 864-151-5620 or 988 in the [place] This is open 24 hours a day in the [place] Text the Crisis Text Line at (918)648-2011 (in the [place] Summary Taking steps to learn and use tension reduction techniques can help calm you and help prevent triggering an anxiety reaction. When used together, medicines, psychotherapy, and tension reduction techniques may be the most effective treatment. Family, friends, and partners can play a big part in supporting you. This information is not intended to replace advice given to you by your health care provider. Make sure you discuss any questions you have with your health care provider. Document Revised: 10/14/2020 Document Reviewed: 07/12/2020 Elsevier Patient Education  2023 [employer].

## 2022-01-11 NOTE — Progress Notes (Signed)
New Patient Office Visit  Subjective    Patient ID: Ralph Robinson, male    DOB: [birth year]  Age: 46 y.o. MRN: [id number]  CC:  Chief Complaint  Patient presents with   Nicotine Dependence   Bronchitis    Started Quitting, 1 week, Experiencing headaches, anxiety, and stress     HPI Ralph Robinson states that he has been having increased feelings of anxiety, depressed moods, and headaches since quitting chewing tobacco 4 days ago.  States that he has been using chewing tobacco since he was 46 years old.  Does endorse history of anxiety and depression, states that he has previously failed Zoloft, hydroxyzine, and Celexa.  States that he has been having difficulty falling asleep and staying asleep.  States that he is drinking approximately 2 bottles of water a day.     01/11/2022    2:30 PM 12/23/2019   11:31 AM 04/12/2011    2:03 PM  Depression screen PHQ 2/9  Decreased Interest 2 2 1   Down, Depressed, Hopeless 1 2 1   PHQ - 2 Score 3 4 2   Altered sleeping 1 0   Tired, decreased energy 3 1   Change in appetite 1 0   Feeling bad or failure about yourself  1 1   Trouble concentrating 2 1   Moving slowly or fidgety/restless 1 1   Suicidal thoughts 0 0   PHQ-9 Score 12 8   Difficult doing work/chores  Extremely dIfficult       01/11/2022    2:31 PM 12/23/2019   11:29 AM  GAD 7 : Generalized Anxiety Score  Nervous, Anxious, on Edge 1 3  Control/stop worrying 1 3  Worry too much - different things 1 3  Trouble relaxing 1 2  Restless 1 2  Easily annoyed or irritable 1 3  Afraid - awful might happen 1 3  Total GAD 7 Score 7 19  Anxiety Difficulty Not difficult at all Extremely difficult       Outpatient Encounter Medications as of 01/11/2022  Medication Sig   albuterol (ACCUNEB) 0.63 MG/3ML nebulizer solution Take 1 ampule by nebulization 4 (four) times daily.   albuterol (PROVENTIL HFA;VENTOLIN HFA) 108 (90 BASE) MCG/ACT inhaler Inhale 2 puffs into the lungs every 6  (six) hours as needed.     FLUoxetine (PROZAC) 20 MG capsule Take 1 capsule (20 mg total) by mouth daily.   montelukast (SINGULAIR) 10 MG tablet Take 10 mg by mouth at bedtime.   traZODone (DESYREL) 50 MG tablet Take 1 tablet (50 mg total) by mouth at bedtime.   triamcinolone ointment (KENALOG) 0.1 % Apply 1 application topically 2 (two) times daily.   [DISCONTINUED] budesonide-formoterol (SYMBICORT) 160-4.5 MCG/ACT inhaler Inhale 2 puffs into the lungs 2 (two) times daily.   [DISCONTINUED] hydrOXYzine (ATARAX/VISTARIL) 10 MG tablet Take 1 tablet (10 mg total) by mouth 3 (three) times daily as needed for anxiety.   [DISCONTINUED] NUCALA 100 MG/ML SOAJ Inject 1 Syringe into the skin every 30 (thirty) days.   [DISCONTINUED] sertraline (ZOLOFT) 50 MG tablet Take 1 tablet (50 mg total) by mouth daily.   [DISCONTINUED] SPIRIVA HANDIHALER 18 MCG inhalation capsule Place 1 capsule into inhaler and inhale daily.   TRELEGY ELLIPTA 200-62.5-25 MCG/ACT AEPB Take 1 puff by mouth daily.   No facility-administered encounter medications on file as of 01/11/2022.    Past Medical History:  Diagnosis Date   Asthma    Paresthesia of both feet    PPD positive  hepatitis due to [place]   Vertigo     No past surgical history on file.  No family history on file.  Social History   Socioeconomic History   Marital status: Married    Spouse name: Not on file   Number of children: Not on file   Years of education: Not on file   Highest education level: Not on file  Occupational History   Not on file  Tobacco Use   Smoking status: Never   Smokeless tobacco: Never  Substance and Sexual Activity   Alcohol use: No   Drug use: No   Sexual activity: Not on file  Other Topics Concern   Not on file  Social History Narrative   Not on file   Social Determinants of Health   Financial Resource Strain: Not on file  Food Insecurity: Not on file  Transportation Needs: Not on file  Physical Activity: Not on  file  Stress: Not on file  Social Connections: Not on file  Intimate Partner Violence: Not on file    Review of Systems  Constitutional: Negative.   HENT: Negative.    Eyes: Negative.   Respiratory:  Negative for shortness of breath.   Cardiovascular:  Negative for chest pain.  Gastrointestinal: Negative.   Genitourinary: Negative.   Musculoskeletal: Negative.   Skin: Negative.   Neurological:  Positive for headaches.  Endo/Heme/Allergies: Negative.   Psychiatric/Behavioral:  Positive for depression. The patient is nervous/anxious and has insomnia.         Objective    BP (!) 112/92 (BP Location: Left Arm, Patient Position: Sitting)   Pulse 86   Ht 5\' 8"  (1.727 m)   BMI 26.68 kg/m   Physical Exam Vitals and nursing note reviewed.  Constitutional:      Appearance: Normal appearance.  HENT:     Head: Normocephalic and atraumatic.     Right Ear: External ear normal.     Left Ear: External ear normal.     Nose: Nose normal.     Mouth/Throat:     Mouth: Mucous membranes are moist.     Pharynx: Oropharynx is clear.  Eyes:     Extraocular Movements: Extraocular movements intact.     Conjunctiva/sclera: Conjunctivae normal.     Pupils: Pupils are equal, round, and reactive to light.  Cardiovascular:     Rate and Rhythm: Normal rate and regular rhythm.     Pulses: Normal pulses.     Heart sounds: Normal heart sounds.  Pulmonary:     Effort: Pulmonary effort is normal.     Breath sounds: Normal breath sounds.  Musculoskeletal:        General: Normal range of motion.     Cervical back: Normal range of motion and neck supple.  Skin:    General: Skin is warm and dry.  Neurological:     General: No focal deficit present.     Mental Status: He is alert and oriented to person, place, and time.  Psychiatric:        Mood and Affect: Mood normal.        Behavior: Behavior normal.        Thought Content: Thought content normal.        Judgment: Judgment normal.        Assessment & Plan:   Problem List Items Addressed This Visit       Other   Generalized anxiety disorder with panic attacks   Relevant Medications   traZODone (DESYREL)  50 MG tablet   FLUoxetine (PROZAC) 20 MG capsule   Major depressive disorder, recurrent episode, mild (HCC) - Primary   Relevant Medications   traZODone (DESYREL) 50 MG tablet   FLUoxetine (PROZAC) 20 MG capsule   Other Visit Diagnoses     Tobacco abuse         1. Major depressive disorder, recurrent episode, mild (HCC) Trial Prozac, trazodone.  Patient education given on supportive care, red flags given for prompt reevaluation.  Patient does have primary care provider at [facility], patient encouraged to schedule follow-up appointment with primary care provider or return to mobile unit as needed.  Patient understands and agrees - traZODone (DESYREL) 50 MG tablet; Take 1 tablet (50 mg total) by mouth at bedtime.  Dispense: 30 tablet; Refill: 1 - FLUoxetine (PROZAC) 20 MG capsule; Take 1 capsule (20 mg total) by mouth daily.  Dispense: 30 capsule; Refill: 1  2. Generalized anxiety disorder with panic attacks  - traZODone (DESYREL) 50 MG tablet; Take 1 tablet (50 mg total) by mouth at bedtime.  Dispense: 30 tablet; Refill: 1 - FLUoxetine (PROZAC) 20 MG capsule; Take 1 capsule (20 mg total) by mouth daily.  Dispense: 30 capsule; Refill: 1  3. Tobacco abuse Congratulated patient on working towards abstinence from chewing tobacco.   I have reviewed the patient's medical history (PMH, PSH, Social History, Family History, Medications, and allergies) , and have been updated if relevant. I spent 30 minutes reviewing chart and  face to face time with patient.     Return in about 4 weeks (around 02/08/2022) for with PCP or [facility].   Loraine Grip Mayers, PA-C

## 2022-02-17 ENCOUNTER — Emergency Department (HOSPITAL_COMMUNITY): Payer: Medicaid Other

## 2022-02-17 ENCOUNTER — Emergency Department (HOSPITAL_COMMUNITY)
Admission: EM | Admit: 2022-02-17 | Discharge: 2022-02-17 | Disposition: A | Discharge status: Home / Self Care | Payer: Medicaid Other | Attending: Emergency Medicine | Admitting: Emergency Medicine

## 2022-02-17 ENCOUNTER — Other Ambulatory Visit: Payer: Self-pay

## 2022-02-17 DIAGNOSIS — J45901 Unspecified asthma with (acute) exacerbation: Secondary | ICD-10-CM | POA: Diagnosis not present

## 2022-02-17 DIAGNOSIS — Z20822 Contact with and (suspected) exposure to covid-19: Secondary | ICD-10-CM | POA: Diagnosis not present

## 2022-02-17 DIAGNOSIS — R0602 Shortness of breath: Secondary | ICD-10-CM | POA: Diagnosis present

## 2022-02-17 LAB — BASIC METABOLIC PANEL
Anion gap: 7 (ref 5–15)
BUN: 15 mg/dL (ref 6–20)
CO2: 24 mmol/L (ref 22–32)
Calcium: 9.2 mg/dL (ref 8.9–10.3)
Chloride: 108 mmol/L (ref 98–111)
Creatinine, Ser: 1.06 mg/dL (ref 0.61–1.24)
GFR, Estimated: >60 mL/min (ref >60)
Glucose, Bld: 147 mg/dL — ABNORMAL HIGH (ref 70–99)
Potassium: 4.1 mmol/L (ref 3.5–5.1)
Sodium: 139 mmol/L (ref 135–145)

## 2022-02-17 LAB — CBC WITH DIFFERENTIAL/PLATELET
Abs Immature Granulocytes: 0.02 10*3/uL (ref 0.00–0.07)
Basophils Absolute: 0 10*3/uL (ref 0.0–0.1)
Basophils Relative: 1 %
Eosinophils Absolute: 0.5 10*3/uL (ref 0.0–0.5)
Eosinophils Relative: 5 %
HCT: 47.3 % (ref 39.0–52.0)
Hemoglobin: 15.1 g/dL (ref 13.0–17.0)
Immature Granulocytes: 0 %
Lymphocytes Relative: 36 %
Lymphs Abs: 3.1 10*3/uL (ref 0.7–4.0)
MCH: 26.6 pg (ref 26.0–34.0)
MCHC: 31.9 g/dL (ref 30.0–36.0)
MCV: 83.4 fL (ref 80.0–100.0)
Monocytes Absolute: 0.4 10*3/uL (ref 0.1–1.0)
Monocytes Relative: 5 %
Neutro Abs: 4.5 10*3/uL (ref 1.7–7.7)
Neutrophils Relative %: 53 %
Platelets: 250 10*3/uL (ref 150–400)
RBC: 5.67 MIL/uL (ref 4.22–5.81)
RDW: 14.7 % (ref 11.5–15.5)
WBC: 8.5 10*3/uL (ref 4.0–10.5)
nRBC: 0 % (ref 0.0–0.2)

## 2022-02-17 LAB — RESP PANEL BY RT-PCR (FLU A&B, COVID) ARPGX2
Influenza A by PCR: NEGATIVE
Influenza B by PCR: NEGATIVE
SARS Coronavirus 2 by RT PCR: NEGATIVE

## 2022-02-17 LAB — BRAIN NATRIURETIC PEPTIDE: B Natriuretic Peptide: 10.4 pg/mL (ref 0.0–100.0)

## 2022-02-17 MED ORDER — SODIUM CHLORIDE 0.9 % IV BOLUS
500.0000 mL | Freq: Once | INTRAVENOUS | Status: AC
  Administered 2022-02-17: 500 mL via INTRAVENOUS

## 2022-02-17 MED ORDER — ALBUTEROL SULFATE (2.5 MG/3ML) 0.083% IN NEBU
2.5000 mg | INHALATION_SOLUTION | Freq: Once | RESPIRATORY_TRACT | Status: AC
  Administered 2022-02-17: 2.5 mg via RESPIRATORY_TRACT
  Filled 2022-02-17: qty 3

## 2022-02-17 MED ORDER — DEXAMETHASONE 6 MG PO TABS: 6.0000 mg | ORAL_TABLET | Freq: Two times a day (BID) | ORAL | 0 refills | Status: DC

## 2022-02-17 NOTE — ED Provider Notes (Signed)
[facility]-EMERGENCY DEPT Provider Note   CSN: [id number] Arrival date & time: 02/17/22  0940     History  Chief Complaint  Patient presents with   Asthma   Shortness of Breath    Ralph Robinson is a 46 y.o. male.  He has a history of asthma.  Complaining of severe shortness of breath that began at 8 AM this morning used inhaler without relief.  Sats were 88% on arrival by EMS.  They gave him 2 duo nebs and 10 mg continuous albuterol 2 g magnesium and IV Solu-Medrol.  He arrives in respiratory distress.  He says he feels a little bit better.  He denies any tobacco.  He has had a cough productive of some yellow sputum.  Denies any fevers nausea vomiting diarrhea  The history is provided by the patient and the EMS personnel.  Asthma This is a recurrent problem. The current episode started 1 to 2 hours ago. The problem occurs constantly. The problem has not changed since onset.Associated symptoms include shortness of breath. Pertinent negatives include no chest pain and no abdominal pain. The symptoms are aggravated by exertion. Nothing relieves the symptoms. Treatments tried: inhaler. The treatment provided no relief.       Home Medications Prior to Admission medications   Medication Sig Start Date End Date Taking? Authorizing Provider  albuterol (ACCUNEB) 0.63 MG/3ML nebulizer solution Take 1 ampule by nebulization 4 (four) times daily. 09/15/19   [provider]  albuterol (PROVENTIL HFA;VENTOLIN HFA) 108 (90 BASE) MCG/ACT inhaler Inhale 2 puffs into the lungs every 6 (six) hours as needed.      [provider]  FLUoxetine (PROZAC) 20 MG capsule Take 1 capsule (20 mg total) by mouth daily. 01/11/22   Mayers, Cari S, PA-C  montelukast (SINGULAIR) 10 MG tablet Take 10 mg by mouth at bedtime.    [provider]  traZODone (DESYREL) 50 MG tablet Take 1 tablet (50 mg total) by mouth at bedtime. 01/11/22   Mayers, Cari S, PA-C  TRELEGY ELLIPTA  200-62.5-25 MCG/ACT AEPB Take 1 puff by mouth daily. 12/01/21   [provider]  triamcinolone ointment (KENALOG) 0.1 % Apply 1 application topically 2 (two) times daily. 09/10/19   [provider]      Allergies    Prednisone    Review of Systems   Review of Systems  Constitutional:  Negative for fever.  HENT:  Negative for sore throat.   Eyes:  Negative for visual disturbance.  Respiratory:  Positive for cough, shortness of breath and wheezing.   Cardiovascular:  Negative for chest pain.  Gastrointestinal:  Negative for abdominal pain.  Genitourinary:  Negative for dysuria.  Skin:  Negative for rash.    Physical Exam Updated Vital Signs BP (!) 110/90   Pulse (!) 122   Temp (!) 97.3 F (36.3 C) (Oral)   Resp (!) 21   Ht 5\' 8"  (1.727 m)   Wt 79.8 kg   SpO2 97%   BMI 26.76 kg/m  Physical Exam Vitals and nursing note reviewed.  Constitutional:      General: He is in acute distress.     Appearance: He is well-developed.  HENT:     Head: Normocephalic and atraumatic.  Eyes:     Conjunctiva/sclera: Conjunctivae normal.  Cardiovascular:     Rate and Rhythm: Regular rhythm. Tachycardia present.     Heart sounds: No murmur heard. Pulmonary:     Effort: Tachypnea, accessory muscle usage and respiratory  distress present.     Breath sounds: Wheezing present.  Abdominal:     Palpations: Abdomen is soft.     Tenderness: There is no abdominal tenderness. There is no guarding or rebound.  Musculoskeletal:        General: No swelling.     Cervical back: Neck supple.  Skin:    General: Skin is warm and dry.     Capillary Refill: Capillary refill takes less than 2 seconds.  Neurological:     General: No focal deficit present.     Mental Status: He is alert.     ED Results / Procedures / Treatments   Labs (all labs ordered are listed, but only abnormal results are displayed) Labs Reviewed  BASIC METABOLIC PANEL - Abnormal; Notable for the following  components:      Result Value   Glucose, Bld 147 (*)    All other components within normal limits  RESP PANEL BY RT-PCR (FLU A&B, COVID) ARPGX2  CBC WITH DIFFERENTIAL/PLATELET  BRAIN NATRIURETIC PEPTIDE    EKG EKG Interpretation  Date/Time:  Thursday February 17 2022 09:50:55 EST Ventricular Rate:  132 PR Interval:  119 QRS Duration: 83 QT Interval:  344 QTC Calculation: 512 R Axis:   96 Text Interpretation: Sinus tachycardia LAE, consider biatrial enlargement Borderline right axis deviation Prolonged QT interval QT longer than prior 8/21 Confirmed by Meridee Score [phone]) on 02/17/2022 9:55:06 AM  Radiology DG Chest Port 1 View  Result Date: 02/17/2022 CLINICAL DATA:  Shortness of breath. EXAM: PORTABLE CHEST 1 VIEW COMPARISON:  January 13, 2022. FINDINGS: The heart size and mediastinal contours are within normal limits. Both lungs are clear. The visualized skeletal structures are unremarkable. IMPRESSION: No active disease. Electronically Signed   By: Lupita Raider M.D.   On: 02/17/2022 10:29    Procedures Procedures    Medications Ordered in ED Medications  sodium chloride 0.9 % bolus 500 mL (0 mLs Intravenous Stopped 02/17/22 1417)  albuterol (PROVENTIL) (2.5 MG/3ML) 0.083% nebulizer solution 2.5 mg (2.5 mg Nebulization Given 02/17/22 1230)    ED Course/ Medical Decision Making/ A&P Clinical Course as of 02/17/22 2035  Thu Feb 17, 2022  1028 Chest x-ray interpreted by me as no acute infiltrates no pneumothorax.  Awaiting radiology reading. [MB]  1046 Reeval patient, he is currently sleeping.  He is on 3 L nasal cannula.  Still has some wheezing. [MB]  1135 She states he is breathing is moderately improved.  He still feels a little too shaky to get another nebulizer treatment right now. [MB]  1406 Patient states he is allergic to prednisone.  He is essentially sent home with Decadron. [MB]    Clinical Course User Index [MB] Terrilee Files, MD                            Medical Decision Making Amount and/or Complexity of Data Reviewed Labs: ordered. Radiology: ordered.  Risk Prescription drug management.   This patient complains of asthma shortness of breath; this involves an extensive number of treatment Options and is a complaint that carries with it a high risk of complications and morbidity. The differential includes asthma, COPD, pneumothorax, PE, pneumonia, flu, COVID  I ordered, reviewed and interpreted labs, which included CBC with normal white count normal hemoglobin, chemistries normal other than elevated glucose, BNP normal, COVID and flu negative I ordered medication IV fluids and albuterol nebulizer and reviewed PMP when indicated. I  ordered imaging studies which included chest x-ray and I independently    visualized and interpreted imaging which showed no acute infiltrates Additional history obtained from EMS Previous records obtained and reviewed in epic including prior ED visits Cardiac monitoring reviewed, sinus tachycardia Social determinants considered, no significant barriers Critical Interventions: None  After the interventions stated above, I reevaluated the patient and found patient's work of breathing to be much improved Admission and further testing considered, he was considered for admission but he feels comfortable with treating his symptoms at home.  Will cover with steroids and he has home nebulizer and inhaler.  Recommended close follow-up with PCP.  Return instructions discussed         Final Clinical Impression(s) / ED Diagnoses Final diagnoses:  Moderate asthma with exacerbation, unspecified whether persistent    Rx / DC Orders ED Discharge Orders          Ordered    dexamethasone (DECADRON) 6 MG tablet  2 times daily        02/17/22 1409              Terrilee Files, MD 02/17/22 2037

## 2022-02-17 NOTE — ED Triage Notes (Addendum)
Pt BIBA from home. Pt began having asthma exacerbation at 0800. Used rescue inhaler w/no relief. Was 88% upon EMS arrival. Given 10mg  albuterol, 2 duonebs, 125 Solumedrol, 1 mg atrovent, and 2 gm Mag IV.  Aox4  BP: 127/84 HR: 120 SPO2: 95 10L aerosol mask

## 2022-02-17 NOTE — Discharge Instructions (Signed)
You were seen in the emergency department for wheezing and shortness of breath.  You had blood work chest x-ray and EKG that did not show any significant abnormalities.  Your wheezing improved after steroids and breathing treatments.  Please continue to use your albuterol every 4 hours as needed.  Decadron for 4 more days.  Follow-up with your primary care doctor.  Return to the emergency department if any worsening or concerning symptoms.

## 2022-02-27 ENCOUNTER — Emergency Department (HOSPITAL_COMMUNITY)
Admission: EM | Admit: 2022-02-27 | Discharge: 2022-02-27 | Disposition: A | Discharge status: Home / Self Care | Payer: Medicaid Other | Attending: Emergency Medicine | Admitting: Emergency Medicine

## 2022-02-27 ENCOUNTER — Other Ambulatory Visit: Payer: Self-pay

## 2022-02-27 ENCOUNTER — Encounter (HOSPITAL_COMMUNITY): Payer: Self-pay | Admitting: *Deleted

## 2022-02-27 ENCOUNTER — Emergency Department (HOSPITAL_COMMUNITY): Payer: Medicaid Other

## 2022-02-27 DIAGNOSIS — Z20822 Contact with and (suspected) exposure to covid-19: Secondary | ICD-10-CM | POA: Diagnosis not present

## 2022-02-27 DIAGNOSIS — R0602 Shortness of breath: Secondary | ICD-10-CM | POA: Diagnosis present

## 2022-02-27 DIAGNOSIS — J4521 Mild intermittent asthma with (acute) exacerbation: Secondary | ICD-10-CM | POA: Insufficient documentation

## 2022-02-27 LAB — CBC WITH DIFFERENTIAL/PLATELET
Abs Immature Granulocytes: 0.12 10*3/uL — ABNORMAL HIGH (ref 0.00–0.07)
Basophils Absolute: 0.1 10*3/uL (ref 0.0–0.1)
Basophils Relative: 1 %
Eosinophils Absolute: 0.4 10*3/uL (ref 0.0–0.5)
Eosinophils Relative: 4 %
HCT: 45.6 % (ref 39.0–52.0)
Hemoglobin: 14.4 g/dL (ref 13.0–17.0)
Immature Granulocytes: 1 %
Lymphocytes Relative: 35 %
Lymphs Abs: 3.9 10*3/uL (ref 0.7–4.0)
MCH: 26.3 pg (ref 26.0–34.0)
MCHC: 31.6 g/dL (ref 30.0–36.0)
MCV: 83.4 fL (ref 80.0–100.0)
Monocytes Absolute: 0.9 10*3/uL (ref 0.1–1.0)
Monocytes Relative: 8 %
Neutro Abs: 5.7 10*3/uL (ref 1.7–7.7)
Neutrophils Relative %: 51 %
Platelets: 203 10*3/uL (ref 150–400)
RBC: 5.47 MIL/uL (ref 4.22–5.81)
RDW: 15 % (ref 11.5–15.5)
WBC: 11.1 10*3/uL — ABNORMAL HIGH (ref 4.0–10.5)
nRBC: 0 % (ref 0.0–0.2)

## 2022-02-27 LAB — I-STAT CHEM 8, ED
BUN: 16 mg/dL (ref 6–20)
Calcium, Ion: 1.16 mmol/L (ref 1.15–1.40)
Chloride: 104 mmol/L (ref 98–111)
Creatinine, Ser: 1 mg/dL (ref 0.61–1.24)
Glucose, Bld: 109 mg/dL — ABNORMAL HIGH (ref 70–99)
HCT: 46 % (ref 39.0–52.0)
Hemoglobin: 15.6 g/dL (ref 13.0–17.0)
Potassium: 3.9 mmol/L (ref 3.5–5.1)
Sodium: 138 mmol/L (ref 135–145)
TCO2: 25 mmol/L (ref 22–32)

## 2022-02-27 LAB — SARS CORONAVIRUS 2 BY RT PCR: SARS Coronavirus 2 by RT PCR: NEGATIVE

## 2022-02-27 MED ORDER — ALBUTEROL SULFATE HFA 108 (90 BASE) MCG/ACT IN AERS
1.0000 | INHALATION_SPRAY | Freq: Once | RESPIRATORY_TRACT | Status: AC
  Administered 2022-02-27: 1 via RESPIRATORY_TRACT

## 2022-02-27 MED ORDER — ALBUTEROL SULFATE HFA 108 (90 BASE) MCG/ACT IN AERS
INHALATION_SPRAY | RESPIRATORY_TRACT | Status: AC
  Filled 2022-02-27: qty 6.7

## 2022-02-27 MED ORDER — ALBUTEROL SULFATE HFA 108 (90 BASE) MCG/ACT IN AERS: 1.0000 | INHALATION_SPRAY | Freq: Four times a day (QID) | RESPIRATORY_TRACT | 0 refills | Status: DC | PRN

## 2022-02-27 MED ORDER — SODIUM CHLORIDE 0.9 % IV BOLUS
500.0000 mL | Freq: Once | INTRAVENOUS | Status: AC
  Administered 2022-02-27: 500 mL via INTRAVENOUS

## 2022-02-27 MED ORDER — IPRATROPIUM BROMIDE 0.02 % IN SOLN
0.5000 mg | Freq: Once | RESPIRATORY_TRACT | Status: AC
  Administered 2022-02-27: 0.5 mg via RESPIRATORY_TRACT
  Filled 2022-02-27: qty 2.5

## 2022-02-27 MED ORDER — ALBUTEROL SULFATE (2.5 MG/3ML) 0.083% IN NEBU: 10.0000 mg | INHALATION_SOLUTION | Freq: Once | RESPIRATORY_TRACT | Status: DC

## 2022-02-27 MED ORDER — DEXAMETHASONE 4 MG PO TABS: 4.0000 mg | ORAL_TABLET | Freq: Two times a day (BID) | ORAL | 0 refills | Status: DC

## 2022-02-27 MED ORDER — ALBUTEROL SULFATE (2.5 MG/3ML) 0.083% IN NEBU
INHALATION_SOLUTION | RESPIRATORY_TRACT | Status: AC
  Filled 2022-02-27: qty 12

## 2022-02-27 MED ORDER — ATROVENT HFA 17 MCG/ACT IN AERS: 2.0000 | INHALATION_SPRAY | Freq: Four times a day (QID) | RESPIRATORY_TRACT | 0 refills | Status: DC | PRN

## 2022-02-27 MED ORDER — ALBUTEROL SULFATE (2.5 MG/3ML) 0.083% IN NEBU
10.0000 mg/h | INHALATION_SOLUTION | Freq: Once | RESPIRATORY_TRACT | Status: AC
  Administered 2022-02-27: 10 mg/h via RESPIRATORY_TRACT

## 2022-02-27 NOTE — ED Notes (Signed)
Wife is going home.  Please contact her if anything changes Simmer,Ganga (Spouse) [phone] [facility])

## 2022-02-27 NOTE — ED Notes (Signed)
Patient is alert.  He states he is feeling better.  He is requesting an inhaler to have before he leaves to ensure he has access to medications.  I will inform MD of the same

## 2022-02-27 NOTE — ED Notes (Signed)
Wife is now at the bedside.

## 2022-02-27 NOTE — ED Provider Notes (Signed)
[facility]-EMERGENCY DEPT Provider Note   CSN: [id number] Arrival date & time: 02/27/22  0345     History  Chief Complaint  Patient presents with   Respiratory Distress    Ralph Robinson is a 46 y.o. male.  The history is provided by the EMS personnel. The history is limited by the condition of the patient.  Wheezing Severity:  Severe Severity compared to prior episodes:  Similar Onset quality:  Sudden Duration:  1 day Timing:  Constant Progression:  Unchanged Chronicity:  Recurrent Context: not exercise   Relieved by:  Nothing Worsened by:  Nothing Ineffective treatments:  Beta-agonist inhaler, home nebulizer and oral steroids Associated symptoms: no chest pain, no fever and no stridor   Risk factors: not exposed to toxic fumes        Home Medications Prior to Admission medications   Medication Sig Start Date End Date Taking? Authorizing Provider  albuterol (VENTOLIN HFA) 108 (90 Base) MCG/ACT inhaler Inhale 1-2 puffs into the lungs every 6 (six) hours as needed for wheezing or shortness of breath. 02/27/22  Yes Birdie Beveridge, MD  dexamethasone (DECADRON) 4 MG tablet Take 1 tablet (4 mg total) by mouth 2 (two) times daily. 02/27/22  Yes Courtne Lighty, MD  albuterol (ACCUNEB) 0.63 MG/3ML nebulizer solution Take 1 ampule by nebulization 4 (four) times daily. 09/15/19   [provider]  albuterol (PROVENTIL HFA;VENTOLIN HFA) 108 (90 BASE) MCG/ACT inhaler Inhale 2 puffs into the lungs every 6 (six) hours as needed.      [provider]  dexamethasone (DECADRON) 6 MG tablet Take 1 tablet (6 mg total) by mouth 2 (two) times daily. 02/17/22   Terrilee FilesButler, Michael C, MD  FLUoxetine (PROZAC) 20 MG capsule Take 1 capsule (20 mg total) by mouth daily. 01/11/22   Mayers, Cari S, PA-C  montelukast (SINGULAIR) 10 MG tablet Take 10 mg by mouth at bedtime.    [provider]  traZODone (DESYREL) 50 MG tablet Take 1 tablet (50 mg total) by  mouth at bedtime. 01/11/22   Mayers, Cari S, PA-C  TRELEGY ELLIPTA 200-62.5-25 MCG/ACT AEPB Take 1 puff by mouth daily. 12/01/21   [provider]  triamcinolone ointment (KENALOG) 0.1 % Apply 1 application topically 2 (two) times daily. 09/10/19   [provider]      Allergies    Prednisone    Review of Systems   Review of Systems  Constitutional:  Negative for fever.  HENT:  Negative for facial swelling.   Eyes:  Negative for redness.  Respiratory:  Positive for wheezing. Negative for stridor.   Cardiovascular:  Negative for chest pain.  All other systems reviewed and are negative.   Physical Exam Updated Vital Signs BP 108/71   Pulse (!) 112   Temp 97.7 F (36.5 C) (Axillary)   Resp 20   SpO2 94%  Physical Exam Constitutional:      Appearance: He is well-developed. He is not diaphoretic.  HENT:     Head: Normocephalic and atraumatic.     Nose: Nose normal.  Eyes:     Conjunctiva/sclera: Conjunctivae normal.     Pupils: Pupils are equal, round, and reactive to light.  Cardiovascular:     Rate and Rhythm: Regular rhythm. Tachycardia present.     Pulses: Normal pulses.  Pulmonary:     Effort: Pulmonary effort is normal. Tachypnea and prolonged expiration present.     Breath sounds: Wheezing present. No rales.  Abdominal:     General:  Bowel sounds are normal.     Palpations: Abdomen is soft.     Tenderness: There is no abdominal tenderness. There is no guarding or rebound.  Musculoskeletal:        General: Normal range of motion.     Cervical back: Normal range of motion and neck supple.  Skin:    General: Skin is warm and dry.     Capillary Refill: Capillary refill takes less than 2 seconds.  Neurological:     General: No focal deficit present.     Mental Status: He is alert and oriented to person, place, and time.     Deep Tendon Reflexes: Reflexes normal.  Psychiatric:        Mood and Affect: Mood normal.        Behavior: Behavior normal.      ED Results / Procedures / Treatments   Labs (all labs ordered are listed, but only abnormal results are displayed) Results for orders placed or performed during the hospital encounter of 02/27/22  CBC with Differential  Result Value Ref Range   WBC 11.1 (H) 4.0 - 10.5 K/uL   RBC 5.47 4.22 - 5.81 MIL/uL   Hemoglobin 14.4 13.0 - 17.0 g/dL   HCT 24.4 01.0 - 27.2 %   MCV 83.4 80.0 - 100.0 fL   MCH 26.3 26.0 - 34.0 pg   MCHC 31.6 30.0 - 36.0 g/dL   RDW 53.6 64.4 - 03.4 %   Platelets 203 150 - 400 K/uL   nRBC 0.0 0.0 - 0.2 %   Neutrophils Relative % 51 %   Neutro Abs 5.7 1.7 - 7.7 K/uL   Lymphocytes Relative 35 %   Lymphs Abs 3.9 0.7 - 4.0 K/uL   Monocytes Relative 8 %   Monocytes Absolute 0.9 0.1 - 1.0 K/uL   Eosinophils Relative 4 %   Eosinophils Absolute 0.4 0.0 - 0.5 K/uL   Basophils Relative 1 %   Basophils Absolute 0.1 0.0 - 0.1 K/uL   Immature Granulocytes 1 %   Abs Immature Granulocytes 0.12 (H) 0.00 - 0.07 K/uL  I-stat chem 8, ED (not at [facility] or [facility])  Result Value Ref Range   Sodium 138 135 - 145 mmol/L   Potassium 3.9 3.5 - 5.1 mmol/L   Chloride 104 98 - 111 mmol/L   BUN 16 6 - 20 mg/dL   Creatinine, Ser 7.42 0.61 - 1.24 mg/dL   Glucose, Bld 595 (H) 70 - 99 mg/dL   Calcium, Ion 6.38 7.56 - 1.40 mmol/L   TCO2 25 22 - 32 mmol/L   Hemoglobin 15.6 13.0 - 17.0 g/dL   HCT 43.3 29.5 - 18.8 %   DG Chest Portable 1 View  Result Date: 02/27/2022 CLINICAL DATA:  46 year old male with respiratory distress. EXAM: PORTABLE CHEST 1 VIEW COMPARISON:  Portable chest 02/17/2022 and earlier. FINDINGS: Portable AP semi upright view at 0444 hours. Mildly lower lung volumes. Mediastinal contours remain normal. Visualized tracheal air column is within normal limits. Allowing for portable technique the lungs are clear. No pneumothorax or pleural effusion. Paucity of bowel gas in the visible abdomen. No osseous abnormality identified. IMPRESSION: Mildly low lung volumes, otherwise  Negative portable chest. Electronically Signed   By: Odessa Fleming M.D.   On: 02/27/2022 05:18   DG Chest Port 1 View  Result Date: 02/17/2022 CLINICAL DATA:  Shortness of breath. EXAM: PORTABLE CHEST 1 VIEW COMPARISON:  January 13, 2022. FINDINGS: The heart size and mediastinal contours are within normal  limits. Both lungs are clear. The visualized skeletal structures are unremarkable. IMPRESSION: No active disease. Electronically Signed   By: Lupita Raider M.D.   On: 02/17/2022 10:29    Radiology DG Chest Portable 1 View  Result Date: 02/27/2022 CLINICAL DATA:  46 year old male with respiratory distress. EXAM: PORTABLE CHEST 1 VIEW COMPARISON:  Portable chest 02/17/2022 and earlier. FINDINGS: Portable AP semi upright view at 0444 hours. Mildly lower lung volumes. Mediastinal contours remain normal. Visualized tracheal air column is within normal limits. Allowing for portable technique the lungs are clear. No pneumothorax or pleural effusion. Paucity of bowel gas in the visible abdomen. No osseous abnormality identified. IMPRESSION: Mildly low lung volumes, otherwise Negative portable chest. Electronically Signed   By: Odessa Fleming M.D.   On: 02/27/2022 05:18    Procedures Procedures    Medications Ordered in ED Medications  albuterol (PROVENTIL) (2.5 MG/3ML) 0.083% nebulizer solution (  Not Given 02/27/22 0401)  albuterol (PROVENTIL) (2.5 MG/3ML) 0.083% nebulizer solution (10 mg/hr Nebulization Given 02/27/22 0400)  ipratropium (ATROVENT) nebulizer solution 0.5 mg (0.5 mg Nebulization Given 02/27/22 0400)  sodium chloride 0.9 % bolus 500 mL (500 mLs Intravenous New Bag/Given 02/27/22 0550)    ED Course/ Medical Decision Making/ A&P                           Medical Decision Making Patient with asthma who presents with SOB and wheezing given magnesium and treatments en route.    Amount and/or Complexity of Data Reviewed Independent Historian: EMS    Details: See above  External Data  Reviewed: notes.    Details: Previous notes reviewed  Labs: ordered.    Details: All labs reviewed:  white count slight elevation 11.1, normal hemoglobin 14.4, platelet count normal.  Normal sodium 138, potassium normal 3.9, normal creatinine 1.16 Radiology: ordered and independent interpretation performed.    Details: Normal CXR  Risk Prescription drug management. Risk Details: Patient markedly improved and speaking in complete sentences and moving air well post CAT.  Resting comfortably in bed.  Stable for discharge.  Will refill inhaler.  Dexamethasone as patient is able to tolerate this steroid.  Strict return   Critical Care Total time providing critical care: 30 minutes (CAT and bipap)   CRITICAL CARE Performed by: Sipriano Fendley K Kentavious Michele-Rasch Total critical care time: 30 minutes Critical care time was exclusive of separately billable procedures and treating other patients. Critical care was necessary to treat or prevent imminent or life-threatening deterioration. Critical care was time spent personally by me on the following activities: development of treatment plan with patient and/or surrogate as well as nursing, discussions with consultants, evaluation of patient's response to treatment, examination of patient, obtaining history from patient or surrogate, ordering and performing treatments and interventions, ordering and review of laboratory studies, ordering and review of radiographic studies, pulse oximetry and re-evaluation of patient's condition.  Final Clinical Impression(s) / ED Diagnoses Final diagnoses:  Mild intermittent asthma with exacerbation   Return for intractable cough, coughing up blood, fevers > 100.4 unrelieved by medication, shortness of breath, intractable vomiting, chest pain, shortness of breath, weakness, numbness, changes in speech, facial asymmetry, abdominal pain, passing out, Inability to tolerate liquids or food, cough, altered mental status or any concerns.  No signs of systemic illness or infection. The patient is nontoxic-appearing on exam and vital signs are within normal limits.  I have reviewed the triage vital signs and the nursing notes. Pertinent labs &  imaging results that were available during my care of the patient were reviewed by me and considered in my medical decision making (see chart for details). After history, exam, and medical workup I feel the patient has been appropriately medically screened and is safe for discharge home. Pertinent diagnoses were discussed with the patient. Patient was given return precautions.  Rx / DC Orders ED Discharge Orders          Ordered    albuterol (VENTOLIN HFA) 108 (90 Base) MCG/ACT inhaler  Every 6 hours PRN        02/27/22 0617    dexamethasone (DECADRON) 4 MG tablet  2 times daily        02/27/22 0617              Cabrini Ruggieri, MD 02/27/22 3267

## 2022-02-27 NOTE — ED Triage Notes (Signed)
Patient arrives from home via EMS.  Patient reported to have been seen approx 2 weeks ago for Resp sx.  He was given prescription for decadron and to use his home neb treatment.  Patient with sudden worsening of sx tonight.  He was found by EMS and Fire to be tripoding, diaphoretic, and with decreased breath sounds.  Patient home neb treatment was not "Working" per the first responder team.  Patient received albuterol 10mg , atrovent 0.5 x 1, and Mag Sulfate 2 grams with EMS.  Patient with improved air movement.  He arrives alert, skin warm and dry.  Patient continues to have increased work of breathing at rest.  He has insp and exp wheezing noted.  He denies any fever recently.  No edema noted on exam.  He denies chest pain

## 2022-02-27 NOTE — ED Notes (Signed)
Patient is resting with no s/sx of distress off of the bipap.  MD has been to bedside.

## 2022-02-27 NOTE — ED Notes (Signed)
Bipap removed per MD request.  Patient is tolerating well.  He is noted to have tachycardia at rest.

## 2022-02-27 NOTE — ED Notes (Signed)
Patient verbalized understanding of discharge instructions and reasons to return to the ED.  Family is enroute to pick him up

## 2022-04-28 ENCOUNTER — Encounter (HOSPITAL_COMMUNITY): Payer: Self-pay

## 2022-04-28 ENCOUNTER — Other Ambulatory Visit: Payer: Self-pay

## 2022-04-28 ENCOUNTER — Emergency Department (HOSPITAL_COMMUNITY)
Admission: EM | Admit: 2022-04-28 | Discharge: 2022-04-29 | Disposition: A | Discharge status: Home / Self Care | Payer: Medicaid Other | Attending: Emergency Medicine | Admitting: Emergency Medicine

## 2022-04-28 ENCOUNTER — Emergency Department (HOSPITAL_COMMUNITY): Payer: Medicaid Other

## 2022-04-28 DIAGNOSIS — J45901 Unspecified asthma with (acute) exacerbation: Secondary | ICD-10-CM | POA: Diagnosis not present

## 2022-04-28 DIAGNOSIS — R0602 Shortness of breath: Secondary | ICD-10-CM | POA: Diagnosis present

## 2022-04-28 DIAGNOSIS — Z8616 Personal history of COVID-19: Secondary | ICD-10-CM | POA: Diagnosis not present

## 2022-04-28 DIAGNOSIS — Z1152 Encounter for screening for COVID-19: Secondary | ICD-10-CM | POA: Insufficient documentation

## 2022-04-28 LAB — CBC WITH DIFFERENTIAL/PLATELET
Abs Immature Granulocytes: 0.03 10*3/uL (ref 0.00–0.07)
Basophils Absolute: 0.1 10*3/uL (ref 0.0–0.1)
Basophils Relative: 1 %
Eosinophils Absolute: 0.8 10*3/uL — ABNORMAL HIGH (ref 0.0–0.5)
Eosinophils Relative: 9 %
HCT: 44.9 % (ref 39.0–52.0)
Hemoglobin: 14.1 g/dL (ref 13.0–17.0)
Immature Granulocytes: 0 %
Lymphocytes Relative: 39 %
Lymphs Abs: 3.5 10*3/uL (ref 0.7–4.0)
MCH: 26.5 pg (ref 26.0–34.0)
MCHC: 31.4 g/dL (ref 30.0–36.0)
MCV: 84.2 fL (ref 80.0–100.0)
Monocytes Absolute: 0.5 10*3/uL (ref 0.1–1.0)
Monocytes Relative: 6 %
Neutro Abs: 4 10*3/uL (ref 1.7–7.7)
Neutrophils Relative %: 45 %
Platelets: 157 10*3/uL (ref 150–400)
RBC: 5.33 MIL/uL (ref 4.22–5.81)
RDW: 14.6 % (ref 11.5–15.5)
WBC: 8.9 10*3/uL (ref 4.0–10.5)
nRBC: 0 % (ref 0.0–0.2)

## 2022-04-28 LAB — BASIC METABOLIC PANEL
Anion gap: 10 (ref 5–15)
BUN: 11 mg/dL (ref 6–20)
CO2: 20 mmol/L — ABNORMAL LOW (ref 22–32)
Calcium: 8.8 mg/dL — ABNORMAL LOW (ref 8.9–10.3)
Chloride: 106 mmol/L (ref 98–111)
Creatinine, Ser: 0.94 mg/dL (ref 0.61–1.24)
GFR, Estimated: >60 mL/min (ref >60)
Glucose, Bld: 95 mg/dL (ref 70–99)
Potassium: 3.7 mmol/L (ref 3.5–5.1)
Sodium: 136 mmol/L (ref 135–145)

## 2022-04-28 LAB — RESP PANEL BY RT-PCR (RSV, FLU A&B, COVID)  RVPGX2
Influenza A by PCR: NEGATIVE
Influenza B by PCR: NEGATIVE
Resp Syncytial Virus by PCR: NEGATIVE
SARS Coronavirus 2 by RT PCR: NEGATIVE

## 2022-04-28 MED ORDER — SODIUM CHLORIDE 0.9 % IV BOLUS
1000.0000 mL | Freq: Once | INTRAVENOUS | Status: AC
  Administered 2022-04-28: 1000 mL via INTRAVENOUS

## 2022-04-28 MED ORDER — IPRATROPIUM-ALBUTEROL 0.5-2.5 (3) MG/3ML IN SOLN
9.0000 mL | Freq: Once | RESPIRATORY_TRACT | Status: DC
  Filled 2022-04-28: qty 9

## 2022-04-28 MED ORDER — METHYLPREDNISOLONE SODIUM SUCC 125 MG IJ SOLR
125.0000 mg | Freq: Every day | INTRAMUSCULAR | Status: DC
  Filled 2022-04-28: qty 2

## 2022-04-28 MED ORDER — ALBUTEROL SULFATE (2.5 MG/3ML) 0.083% IN NEBU
10.0000 mg/h | INHALATION_SOLUTION | Freq: Once | RESPIRATORY_TRACT | Status: AC
  Administered 2022-04-28: 10 mg/h via RESPIRATORY_TRACT
  Filled 2022-04-28: qty 12

## 2022-04-28 MED ORDER — METHYLPREDNISOLONE SODIUM SUCC 125 MG IJ SOLR
125.0000 mg | Freq: Once | INTRAMUSCULAR | Status: AC
  Administered 2022-04-28: 125 mg via INTRAVENOUS
  Filled 2022-04-28: qty 2

## 2022-04-28 MED ORDER — MAGNESIUM SULFATE 2 GM/50ML IV SOLN
2.0000 g | Freq: Once | INTRAVENOUS | Status: AC
  Administered 2022-04-28: 2 g via INTRAVENOUS
  Filled 2022-04-28: qty 50

## 2022-04-28 NOTE — ED Provider Notes (Signed)
[place] EMERGENCY DEPARTMENT AT [facility] Provider Note   CSN: [id number] Arrival date & time: 04/28/22  2157     History  Chief Complaint  Patient presents with   Shortness of Breath    Ralph Robinson is a 47 y.o. male.  HPI   Patient with medical history including asthma presents with shortness of breath.  Patient states that this started suddenly today, states that he was walking up a flight of stairs started coughing and felt his chest got very tight, he states he started to wheeze, states he tried his albuterol inhaler without much relief.  He does not have any chest pain or pleuritic chest pain, he states he feels short of breath because he cannot get a good breath they are in.  He states that he has not had any fevers chills cough congestion general body aches, he states that he has severe asthma and is known to have it triggered with exercise.  Patient states since he had COVID last year his asthma has gotten worse,  states that he has been using his inhaler as well as his nebulizer constantly without significant relief.   states that he has not seen his pulmonologist in a while.   denies any history of PEs or DVTs currently not on hormone therapy denies any leg pain calf tenderness, leg swelling no recent surgeries long immobilizations.   Home Medications Prior to Admission medications   Medication Sig Start Date End Date Taking? Authorizing Provider  dexamethasone (DECADRON) 4 MG tablet Take 2 tablets (8 mg total) by mouth once for 1 dose. 04/29/22 04/29/22 Yes Carroll Sage, PA-C  ipratropium (ATROVENT HFA) 17 MCG/ACT inhaler Inhale 2 puffs into the lungs every 6 (six) hours as needed for wheezing. 04/29/22  Yes Carroll Sage, PA-C  albuterol (ACCUNEB) 0.63 MG/3ML nebulizer solution Take 1 ampule by nebulization 4 (four) times daily. 09/15/19   [provider]  albuterol (PROVENTIL HFA;VENTOLIN HFA) 108 (90 BASE) MCG/ACT inhaler Inhale 2 puffs  into the lungs every 6 (six) hours as needed.      [provider]  albuterol (VENTOLIN HFA) 108 (90 Base) MCG/ACT inhaler Inhale 1-2 puffs into the lungs every 6 (six) hours as needed for wheezing or shortness of breath. 02/27/22   Palumbo, April, MD  FLUoxetine (PROZAC) 20 MG capsule Take 1 capsule (20 mg total) by mouth daily. 01/11/22   Mayers, Cari S, PA-C  montelukast (SINGULAIR) 10 MG tablet Take 10 mg by mouth at bedtime.    [provider]  traZODone (DESYREL) 50 MG tablet Take 1 tablet (50 mg total) by mouth at bedtime. 01/11/22   Mayers, Cari S, PA-C  TRELEGY ELLIPTA 200-62.5-25 MCG/ACT AEPB Take 1 puff by mouth daily. 12/01/21   [provider]  triamcinolone ointment (KENALOG) 0.1 % Apply 1 application topically 2 (two) times daily. 09/10/19   [provider]      Allergies    Prednisone    Review of Systems   Review of Systems  Unable to perform ROS: Severe respiratory distress    Physical Exam Updated Vital Signs BP 103/68   Pulse (!) 101   Temp 98.3 F (36.8 C)   Resp 18   Ht 5\' 1"  (1.549 m)   Wt 78.9 kg   SpO2 94%   BMI 32.88 kg/m  Physical Exam Vitals and nursing note reviewed.  Constitutional:      General: He is in acute distress.     Appearance:  He is not ill-appearing.  HENT:     Head: Normocephalic and atraumatic.     Nose: No congestion.  Eyes:     Conjunctiva/sclera: Conjunctivae normal.  Cardiovascular:     Rate and Rhythm: Regular rhythm. Tachycardia present.     Pulses: Normal pulses.     Heart sounds: No murmur heard.    No friction rub. No gallop.  Pulmonary:     Effort: Respiratory distress present.     Breath sounds: Wheezing present. No rhonchi or rales.     Comments: Presents in respiratory distress, pursed lips, accessory muscle usage, tachypnea, patient had tight sounding chest with expiratory wheezing present, no rales or rhonchi noted. Musculoskeletal:     Right lower leg: No edema.     Left lower  leg: No edema.  Skin:    General: Skin is warm and dry.  Neurological:     Mental Status: He is alert.  Psychiatric:        Mood and Affect: Mood normal.     ED Results / Procedures / Treatments   Labs (all labs ordered are listed, but only abnormal results are displayed) Labs Reviewed  BASIC METABOLIC PANEL - Abnormal; Notable for the following components:      Result Value   CO2 20 (*)    Calcium 8.8 (*)    All other components within normal limits  CBC WITH DIFFERENTIAL/PLATELET - Abnormal; Notable for the following components:   Eosinophils Absolute 0.8 (*)    All other components within normal limits  RESP PANEL BY RT-PCR (RSV, FLU A&B, COVID)  [id number]    EKG EKG Interpretation  Date/Time:  Thursday April 28 2022 22:07:08 EST Ventricular Rate:  102 PR Interval:  138 QRS Duration: 95 QT Interval:  346 QTC Calculation: 451 R Axis:   90 Text Interpretation: Sinus tachycardia Borderline right axis deviation ST elev, probable normal early repol pattern No significant change since last tracing Confirmed by Jacalyn Lefevre [phone]) on 04/28/2022 10:11:15 PM  Radiology DG Chest Portable 1 View  Result Date: 04/28/2022 CLINICAL DATA:  Shortness of breath EXAM: PORTABLE CHEST 1 VIEW COMPARISON:  Chest x-ray 02/27/2022 FINDINGS: The heart size and mediastinal contours are within normal limits. Both lungs are clear. The visualized skeletal structures are unremarkable. IMPRESSION: No active disease. Electronically Signed   By: Darliss Cheney M.D.   On: 04/28/2022 22:40    Procedures .Critical Care  Performed by: Carroll Sage, PA-C Authorized by: Carroll Sage, PA-C   Critical care provider statement:    Critical care time (minutes):  30   Critical care was necessary to treat or prevent imminent or life-threatening deterioration of the following conditions:  Respiratory failure   Critical care was time spent personally by me on the following activities:   Development of treatment plan with patient or surrogate, discussions with consultants, evaluation of patient's response to treatment, examination of patient, ordering and review of laboratory studies, ordering and review of radiographic studies, ordering and performing treatments and interventions, pulse oximetry, re-evaluation of patient's condition and review of old charts   I assumed direction of critical care for this patient from another provider in my specialty: no     Care discussed with: admitting provider       Medications Ordered in ED Medications  magnesium sulfate IVPB 2 g 50 mL (0 g Intravenous Stopped 04/29/22 0027)  sodium chloride 0.9 % bolus 1,000 mL (0 mLs Intravenous Stopped 04/28/22 2338)  albuterol (PROVENTIL) (2.5 MG/3ML)  0.083% nebulizer solution (10 mg/hr Nebulization Given 04/28/22 2226)  methylPREDNISolone sodium succinate (SOLU-MEDROL) 125 mg/2 mL injection 125 mg (125 mg Intravenous Given 04/28/22 2309)    ED Course/ Medical Decision Making/ A&P                             Medical Decision Making Amount and/or Complexity of Data Reviewed Labs: ordered. Radiology: ordered.  Risk Prescription drug management.   This patient presents to the ED for concern of shortness of breath, this involves an extensive number of treatment options, and is a complaint that carries with it a high risk of complications and morbidity.  The differential diagnosis includes asthma exacerbation, pneumothorax, PE, ACS,    Additional history obtained:  Additional history obtained from N/A External records from outside source obtained and reviewed including recent hospitalization   Co morbidities that complicate the patient evaluation  Asthma exacerbation  Social Determinants of Health:  N/A    Lab Tests:  I Ordered, and personally interpreted labs.  The pertinent results include: CBC is unremarkable, BMP shows CO2 of 20, calcium 8.8, respiratory panel  negative   Imaging Studies ordered:  I ordered imaging studies including chest x-ray I independently visualized and interpreted imaging which showed unremarkable I agree with the radiologist interpretation   Cardiac Monitoring:  The patient was maintained on a cardiac monitor.  I personally viewed and interpreted the cardiac monitored which showed an underlying rhythm of: Without signs of ischemia   Medicines ordered and prescription drug management:  I ordered medication including fluid I have reviewed the patients home medicines and have made adjustments as needed  Critical Interventions:  Presents in respiratory distress, will provide patient with magnesium, continuous neb, steroids and reassess. Patient was reassessed, he is resting more comfortably, decreased assessory muscle usage, lung sounds have improved with the wheezing present we will continue to monitor   Reevaluation:  Patient was reassessed after breathing treatment, he still has expiratory wheezing but has improved immensely from this arrival.  Will continue to monitor to ensure he does not have another episode.  Patient was ambulated without difficulty did not wheeze, O2 sats remained above 95%, patient has been observed in the ER for 4 and half hours, his O2 sats have remained stable, I discussed possibly admission for further observation/further treatment as he still has some wheezing present during repeat examination, patient states that he feels stable enough to go home, I find this agreeable.  Given strict return precautions   Consultations Obtained:  N/A    Test Considered:  Hospital admission with shared decision making this will be deferred    Rule out I have low suspicion for ACS as history is atypical, patient has no cardiac history, EKG was  without signs of ischemia presentation consistent with known history of asthma. low suspicion for PE as patient denies pleuritic chest pain, patient  denies leg pain, no pedal edema noted on exam, presentation again is more consistent with asthma.   Low suspicion for systemic infection as patient is nontoxic-appearing, vital signs reassuring, no obvious source infection noted on exam.     Dispostion and problem list  After consideration of the diagnostic results and the patients response to treatment, I feel that the patent would benefit from discharge.  Acute on chronic asthma exacerbation-will discharge patient home on additional course of steroids, (single dose of Decadron as patient has an allergy to prednisone) will refill his Atrovent, and  have him follow-up with his pulmonologist for further evaluation and strict return precautions.           Final Clinical Impression(s) / ED Diagnoses Final diagnoses:  Moderate asthma with exacerbation, unspecified whether persistent    Rx / DC Orders ED Discharge Orders          Ordered    ipratropium (ATROVENT HFA) 17 MCG/ACT inhaler  Every 6 hours PRN        04/29/22 0248    dexamethasone (DECADRON) 4 MG tablet   Once        04/29/22 0248              Marcello Fennel, PA-C 04/29/22 0249    Quintella Reichert, MD 05/09/22 2235

## 2022-04-28 NOTE — ED Triage Notes (Signed)
States that he is having a asthma attack that started 1 hours ago. Attempted to use his inhaler without any relief.

## 2022-04-29 MED ORDER — ATROVENT HFA 17 MCG/ACT IN AERS: 2.0000 | INHALATION_SPRAY | Freq: Four times a day (QID) | RESPIRATORY_TRACT | 12 refills | Status: DC | PRN

## 2022-04-29 MED ORDER — DEXAMETHASONE 4 MG PO TABS: 8.0000 mg | ORAL_TABLET | Freq: Once | ORAL | 0 refills | Status: AC

## 2022-04-29 NOTE — ED Notes (Signed)
Pt stayed 94%-96% on room air while walking.

## 2022-04-29 NOTE — Discharge Instructions (Signed)
Your lab work and imaging were all reassuring, I have given you a dose of steroids please take as prescribed.  also refilled your inhaler.  Please continue to use your nebulizer when you feel like your asthma is flaring up, use  your rescue inhaler as needed.  It is very importantly follow-up with your pulmonologist please call today for a follow-up appointment  Come back to the emergency department if you develop chest pain, shortness of breath, severe abdominal pain, uncontrolled nausea, vomiting, diarrhea.

## 2022-05-12 ENCOUNTER — Emergency Department (HOSPITAL_COMMUNITY): Payer: Medicaid Other

## 2022-05-12 ENCOUNTER — Other Ambulatory Visit: Payer: Self-pay

## 2022-05-12 ENCOUNTER — Encounter (HOSPITAL_COMMUNITY): Payer: Self-pay | Admitting: Internal Medicine

## 2022-05-12 ENCOUNTER — Inpatient Hospital Stay (HOSPITAL_COMMUNITY)
Admission: EM | Admit: 2022-05-12 | Discharge: 2022-05-15 | DRG: 203 | Disposition: A | Discharge status: Home / Self Care | Payer: Medicaid Other | Attending: Internal Medicine | Admitting: Internal Medicine

## 2022-05-12 DIAGNOSIS — J45901 Unspecified asthma with (acute) exacerbation: Principal | ICD-10-CM

## 2022-05-12 DIAGNOSIS — Z6827 Body mass index (BMI) 27.0-27.9, adult: Secondary | ICD-10-CM

## 2022-05-12 DIAGNOSIS — Y92239 Unspecified place in hospital as the place of occurrence of the external cause: Secondary | ICD-10-CM | POA: Diagnosis present

## 2022-05-12 DIAGNOSIS — R0603 Acute respiratory distress: Secondary | ICD-10-CM | POA: Diagnosis present

## 2022-05-12 DIAGNOSIS — E876 Hypokalemia: Secondary | ICD-10-CM | POA: Diagnosis present

## 2022-05-12 DIAGNOSIS — D7219 Other eosinophilia: Secondary | ICD-10-CM | POA: Diagnosis present

## 2022-05-12 DIAGNOSIS — Z79899 Other long term (current) drug therapy: Secondary | ICD-10-CM

## 2022-05-12 DIAGNOSIS — F410 Panic disorder [episodic paroxysmal anxiety] without agoraphobia: Secondary | ICD-10-CM | POA: Diagnosis present

## 2022-05-12 DIAGNOSIS — Z1152 Encounter for screening for COVID-19: Secondary | ICD-10-CM

## 2022-05-12 DIAGNOSIS — Z888 Allergy status to other drugs, medicaments and biological substances status: Secondary | ICD-10-CM

## 2022-05-12 DIAGNOSIS — T380X5A Adverse effect of glucocorticoids and synthetic analogues, initial encounter: Secondary | ICD-10-CM | POA: Diagnosis present

## 2022-05-12 DIAGNOSIS — F32A Depression, unspecified: Secondary | ICD-10-CM | POA: Diagnosis present

## 2022-05-12 DIAGNOSIS — F411 Generalized anxiety disorder: Secondary | ICD-10-CM | POA: Diagnosis present

## 2022-05-12 DIAGNOSIS — G4700 Insomnia, unspecified: Secondary | ICD-10-CM | POA: Diagnosis present

## 2022-05-12 DIAGNOSIS — R7303 Prediabetes: Secondary | ICD-10-CM | POA: Diagnosis present

## 2022-05-12 DIAGNOSIS — E663 Overweight: Secondary | ICD-10-CM | POA: Diagnosis present

## 2022-05-12 DIAGNOSIS — J441 Chronic obstructive pulmonary disease with (acute) exacerbation: Secondary | ICD-10-CM | POA: Diagnosis present

## 2022-05-12 DIAGNOSIS — M25512 Pain in left shoulder: Secondary | ICD-10-CM | POA: Diagnosis present

## 2022-05-12 DIAGNOSIS — J4551 Severe persistent asthma with (acute) exacerbation: Principal | ICD-10-CM | POA: Diagnosis present

## 2022-05-12 LAB — COMPREHENSIVE METABOLIC PANEL
ALT: 29 U/L (ref 0–44)
AST: 25 U/L (ref 15–41)
Albumin: 4.1 g/dL (ref 3.5–5.0)
Alkaline Phosphatase: 46 U/L (ref 38–126)
Anion gap: 11 (ref 5–15)
BUN: 11 mg/dL (ref 6–20)
CO2: 23 mmol/L (ref 22–32)
Calcium: 8.9 mg/dL (ref 8.9–10.3)
Chloride: 103 mmol/L (ref 98–111)
Creatinine, Ser: 0.88 mg/dL (ref 0.61–1.24)
GFR, Estimated: >60 mL/min (ref >60)
Glucose, Bld: 114 mg/dL — ABNORMAL HIGH (ref 70–99)
Potassium: 3.6 mmol/L (ref 3.5–5.1)
Sodium: 137 mmol/L (ref 135–145)
Total Bilirubin: 0.6 mg/dL (ref 0.3–1.2)
Total Protein: 7.4 g/dL (ref 6.5–8.1)

## 2022-05-12 LAB — CBC WITH DIFFERENTIAL/PLATELET
Abs Immature Granulocytes: 0.04 10*3/uL (ref 0.00–0.07)
Basophils Absolute: 0 10*3/uL (ref 0.0–0.1)
Basophils Relative: 0 %
Eosinophils Absolute: 0.6 10*3/uL — ABNORMAL HIGH (ref 0.0–0.5)
Eosinophils Relative: 6 %
HCT: 46.1 % (ref 39.0–52.0)
Hemoglobin: 14.8 g/dL (ref 13.0–17.0)
Immature Granulocytes: 0 %
Lymphocytes Relative: 41 %
Lymphs Abs: 4.3 10*3/uL — ABNORMAL HIGH (ref 0.7–4.0)
MCH: 26.9 pg (ref 26.0–34.0)
MCHC: 32.1 g/dL (ref 30.0–36.0)
MCV: 83.7 fL (ref 80.0–100.0)
Monocytes Absolute: 0.5 10*3/uL (ref 0.1–1.0)
Monocytes Relative: 5 %
Neutro Abs: 5.1 10*3/uL (ref 1.7–7.7)
Neutrophils Relative %: 48 %
Platelets: 168 10*3/uL (ref 150–400)
RBC: 5.51 MIL/uL (ref 4.22–5.81)
RDW: 14.4 % (ref 11.5–15.5)
WBC: 10.5 10*3/uL (ref 4.0–10.5)
nRBC: 0 % (ref 0.0–0.2)

## 2022-05-12 LAB — RESP PANEL BY RT-PCR (RSV, FLU A&B, COVID)  RVPGX2
Influenza A by PCR: NEGATIVE
Influenza B by PCR: NEGATIVE
Resp Syncytial Virus by PCR: NEGATIVE
SARS Coronavirus 2 by RT PCR: NEGATIVE

## 2022-05-12 LAB — LIPASE, BLOOD: Lipase: 39 U/L (ref 11–51)

## 2022-05-12 MED ORDER — ALBUTEROL SULFATE (2.5 MG/3ML) 0.083% IN NEBU
2.5000 mg | INHALATION_SOLUTION | RESPIRATORY_TRACT | Status: DC | PRN
  Administered 2022-05-12 – 2022-05-15 (×8): 2.5 mg via RESPIRATORY_TRACT
  Filled 2022-05-12 (×4): qty 3

## 2022-05-12 MED ORDER — ENOXAPARIN SODIUM 40 MG/0.4ML IJ SOSY
40.0000 mg | PREFILLED_SYRINGE | INTRAMUSCULAR | Status: DC
  Administered 2022-05-13: 40 mg via SUBCUTANEOUS
  Filled 2022-05-12 (×2): qty 0.4

## 2022-05-12 MED ORDER — ONDANSETRON HCL 4 MG PO TABS: 4.0000 mg | ORAL_TABLET | Freq: Four times a day (QID) | ORAL | Status: DC | PRN

## 2022-05-12 MED ORDER — ACETAMINOPHEN 650 MG RE SUPP: 650.0000 mg | Freq: Four times a day (QID) | RECTAL | Status: DC | PRN

## 2022-05-12 MED ORDER — KETOROLAC TROMETHAMINE 15 MG/ML IJ SOLN
15.0000 mg | Freq: Once | INTRAMUSCULAR | Status: AC
  Administered 2022-05-12: 15 mg via INTRAVENOUS
  Filled 2022-05-12: qty 1

## 2022-05-12 MED ORDER — ALBUTEROL SULFATE (2.5 MG/3ML) 0.083% IN NEBU
20.0000 mg | INHALATION_SOLUTION | RESPIRATORY_TRACT | Status: AC
  Administered 2022-05-12 (×2): 20 mg via RESPIRATORY_TRACT
  Filled 2022-05-12: qty 24

## 2022-05-12 MED ORDER — PNEUMOCOCCAL 20-VAL CONJ VACC 0.5 ML IM SUSY: 0.5000 mL | PREFILLED_SYRINGE | INTRAMUSCULAR | Status: DC | PRN

## 2022-05-12 MED ORDER — TRAZODONE HCL 50 MG PO TABS
50.0000 mg | ORAL_TABLET | Freq: Every day | ORAL | Status: DC
  Administered 2022-05-12 – 2022-05-14 (×3): 50 mg via ORAL
  Filled 2022-05-12 (×3): qty 1

## 2022-05-12 MED ORDER — ACETAMINOPHEN 500 MG PO TABS
1000.0000 mg | ORAL_TABLET | Freq: Once | ORAL | Status: AC
  Administered 2022-05-12: 1000 mg via ORAL
  Filled 2022-05-12: qty 2

## 2022-05-12 MED ORDER — DEXAMETHASONE SODIUM PHOSPHATE 10 MG/ML IJ SOLN
8.0000 mg | Freq: Two times a day (BID) | INTRAMUSCULAR | Status: DC
  Administered 2022-05-13: 8 mg via INTRAVENOUS
  Filled 2022-05-12: qty 1

## 2022-05-12 MED ORDER — FLUOXETINE HCL 20 MG PO CAPS: 20.0000 mg | ORAL_CAPSULE | Freq: Every day | ORAL | Status: DC

## 2022-05-12 MED ORDER — MONTELUKAST SODIUM 10 MG PO TABS
10.0000 mg | ORAL_TABLET | Freq: Every day | ORAL | Status: DC
  Administered 2022-05-12 – 2022-05-14 (×3): 10 mg via ORAL
  Filled 2022-05-12 (×4): qty 1

## 2022-05-12 MED ORDER — ACETAMINOPHEN 325 MG PO TABS: 650.0000 mg | ORAL_TABLET | Freq: Four times a day (QID) | ORAL | Status: DC | PRN

## 2022-05-12 MED ORDER — OXYCODONE HCL 5 MG PO TABS
5.0000 mg | ORAL_TABLET | Freq: Four times a day (QID) | ORAL | Status: DC | PRN
  Administered 2022-05-12 – 2022-05-14 (×3): 5 mg via ORAL
  Filled 2022-05-12 (×3): qty 1

## 2022-05-12 MED ORDER — IPRATROPIUM-ALBUTEROL 0.5-2.5 (3) MG/3ML IN SOLN
3.0000 mL | RESPIRATORY_TRACT | Status: DC
  Administered 2022-05-12 – 2022-05-13 (×2): 3 mL via RESPIRATORY_TRACT
  Filled 2022-05-12 (×2): qty 3

## 2022-05-12 MED ORDER — ONDANSETRON HCL 4 MG/2ML IJ SOLN: 4.0000 mg | Freq: Four times a day (QID) | INTRAMUSCULAR | Status: DC | PRN

## 2022-05-12 MED ORDER — CHLORHEXIDINE GLUCONATE CLOTH 2 % EX PADS
6.0000 | MEDICATED_PAD | Freq: Every day | CUTANEOUS | Status: DC
  Administered 2022-05-13 – 2022-05-14 (×2): 6 via TOPICAL

## 2022-05-12 MED ORDER — ALBUTEROL SULFATE (2.5 MG/3ML) 0.083% IN NEBU
INHALATION_SOLUTION | RESPIRATORY_TRACT | Status: AC
  Administered 2022-05-12: 2.5 mg via RESPIRATORY_TRACT
  Filled 2022-05-12: qty 6

## 2022-05-12 MED ORDER — BUDESONIDE 0.5 MG/2ML IN SUSP
2.0000 mg | Freq: Two times a day (BID) | RESPIRATORY_TRACT | Status: DC
  Administered 2022-05-12 – 2022-05-14 (×5): 2 mg via RESPIRATORY_TRACT
  Filled 2022-05-12 (×6): qty 8

## 2022-05-12 NOTE — ED Provider Notes (Signed)
[facility] Provider Note   CSN: [id number] Arrival date & time: 05/12/22  1941     History Chief Complaint  Patient presents with   Respiratory Distress    HPI Ralph Robinson is a 47 y.o. male presenting for respiratory distress.  He states that 2 hours ago he had sudden onset of respiratory arrest.  He could not breathe at all EMS was called he was put on CPAP.  History of asthma never intubated.  Given albuterol Solu-Medrol magnesium and route without symptomatic resolution.  Continues to require BiPAP per respiratory therapy evaluate patient for catheter.  Patient states he felt totally fine yesterday and that all symptoms of started throughout the day today.   Patient's recorded medical, surgical, social, medication list and allergies were reviewed in the Snapshot window as part of the initial history.   Review of Systems   Review of Systems  Constitutional:  Negative for chills and fever.  HENT:  Negative for ear pain and sore throat.   Eyes:  Negative for pain and visual disturbance.  Respiratory:  Positive for shortness of breath and wheezing. Negative for cough.   Cardiovascular:  Negative for chest pain and palpitations.  Gastrointestinal:  Negative for abdominal pain and vomiting.  Genitourinary:  Negative for dysuria and hematuria.  Musculoskeletal:  Negative for arthralgias and back pain.  Skin:  Negative for color change and rash.  Neurological:  Negative for seizures and syncope.  All other systems reviewed and are negative.   Physical Exam Updated Vital Signs BP 134/88   Pulse (!) 113   Resp (!) 25   SpO2 98%  Physical Exam Vitals and nursing note reviewed.  Constitutional:      General: He is not in acute distress.    Appearance: He is well-developed.  HENT:     Head: Normocephalic and atraumatic.  Eyes:     Conjunctiva/sclera: Conjunctivae normal.  Cardiovascular:     Rate and Rhythm: Normal rate and  regular rhythm.     Heart sounds: No murmur heard. Pulmonary:     Effort: Pulmonary effort is normal. No respiratory distress.     Breath sounds: Wheezing present.  Abdominal:     Palpations: Abdomen is soft.     Tenderness: There is no abdominal tenderness.  Musculoskeletal:        General: No swelling.     Cervical back: Neck supple.  Skin:    General: Skin is warm and dry.     Capillary Refill: Capillary refill takes less than 2 seconds.  Neurological:     Mental Status: He is alert.  Psychiatric:        Mood and Affect: Mood normal.      ED Course/ Medical Decision Making/ A&P Clinical Course as of 05/12/22 2305  Thu May 12, 2022  2106 Off bipap to [facility]  [CC]    Clinical Course User Index [CC] Tretha Sciara, MD    Procedures .Critical Care  Performed by: Tretha Sciara, MD Authorized by: Tretha Sciara, MD   Critical care provider statement:    Critical care time (minutes):  30   Critical care was necessary to treat or prevent imminent or life-threatening deterioration of the following conditions:  Respiratory failure (Asthma on continuous through BiPAP)   Critical care was time spent personally by me on the following activities:  Development of treatment plan with patient or surrogate, discussions with consultants, evaluation of patient's response to treatment, examination of patient,  ordering and review of laboratory studies, ordering and review of radiographic studies, ordering and performing treatments and interventions, pulse oximetry, re-evaluation of patient's condition and review of old charts    Medications Ordered in ED Medications  albuterol (PROVENTIL) (2.5 MG/3ML) 0.083% nebulizer solution 2.5 mg (2.5 mg Nebulization Given 05/12/22 1959)  albuterol (PROVENTIL) (2.5 MG/3ML) 0.083% nebulizer solution 20 mg (0 mg Nebulization Stopped 05/12/22 2127)  Chlorhexidine Gluconate Cloth 2 % PADS 6 each (has no administration in time range)  dexamethasone  (DECADRON) injection 8 mg (has no administration in time range)  montelukast (SINGULAIR) tablet 10 mg (has no administration in time range)  FLUoxetine (PROZAC) capsule 20 mg (has no administration in time range)  traZODone (DESYREL) tablet 50 mg (has no administration in time range)  budesonide (PULMICORT) nebulizer solution 2 mg (has no administration in time range)  enoxaparin (LOVENOX) injection 40 mg (has no administration in time range)  acetaminophen (TYLENOL) tablet 650 mg (has no administration in time range)    Or  acetaminophen (TYLENOL) suppository 650 mg (has no administration in time range)  ondansetron (ZOFRAN) tablet 4 mg (has no administration in time range)    Or  ondansetron (ZOFRAN) injection 4 mg (has no administration in time range)  ipratropium-albuterol (DUONEB) 0.5-2.5 (3) MG/3ML nebulizer solution 3 mL (has no administration in time range)  acetaminophen (TYLENOL) tablet 1,000 mg (1,000 mg Oral Given 05/12/22 2133)  ketorolac (TORADOL) 15 MG/ML injection 15 mg (15 mg Intravenous Given 05/12/22 2134)    Medical Decision Making:    Ralph Robinson is a 47 y.o. male who presented to the ED today with shortness of breath detailed above.     Handoff received from EMS.  Patient placed on continuous vitals and telemetry monitoring while in ED which was reviewed periodically.   Complete initial physical exam performed, notably the patient  was in acute respiratory distress requiring CPAP to maintain oxygen saturations.  Tachycardic to the 140s initially on presentation.  I was called emergently over to patient's bedside.  He was transitioned to BiPAP successfully and had improvement of his respiratory distress..      Reviewed and confirmed nursing documentation for past medical history, family history, social history.    Initial Assessment:   With the patient's presentation of shortness of breath and diffuse wheezing as, most likely diagnosis is exacerbation, acute, severe.  Other diagnoses were considered including (but not limited to) pneumothorax, pneumonia. These are considered less likely due to history of present illness and physical exam findings.   This is most consistent with an acute life/limb threatening illness complicated by underlying chronic conditions.  Initial Plan:  Critical interventions needed in emergency department.  Patient was able to be transitioned to BiPAP with a continuous albuterol 20 mg administered for the first hour.  Will frequently reassess.  Patient has received magnesium, Solu-Medrol and will be closely monitored Screening labs including CBC and Metabolic panel to evaluate for infectious or metabolic etiology of disease.  Urinalysis with reflex culture ordered to evaluate for UTI or relevant urologic/nephrologic pathology.  CXR to evaluate for structural/infectious intrathoracic pathology.  EKG to evaluate for cardiac pathology. Objective evaluation as below reviewed with plan for close reassessment  Initial Study Results:   Laboratory  All laboratory results reviewed without evidence of clinically relevant pathology.    EKG EKG was reviewed independently. Rate, rhythm, axis, intervals all examined and without medically relevant abnormality. ST segments without concerns for elevations.    Radiology  All images reviewed  independently. Agree with radiology report at this time.   DG Chest Portable 1 View  Result Date: 05/12/2022 CLINICAL DATA:  Shortness of breath EXAM: PORTABLE CHEST 1 VIEW COMPARISON:  04/28/2022 FINDINGS: The heart size and mediastinal contours are within normal limits. Both lungs are clear. The visualized skeletal structures are unremarkable. IMPRESSION: No active disease. Electronically Signed   By: Donavan Foil M.D.   On: 05/12/2022 20:25     Final Assessment and Plan:   Patient reported frequent reassessments emergency department.  Objective findings without evidence of pneumonia.  Wheezing grossly  improved after administration of multiple treatments in emergency department. Given gradual improvement in symptoms and decreasing heart rate, patient will be able to be transitioned off of BiPAP.  He was transitioned to 4 L nasal cannula and observed for an additional 2 hours. Remained hemodynamically stable no acute distress.  Discussed case with hospitalist who agrees with need for admission.   Disposition:   Based on the above findings, I believe this patient is stable for admission.    Patient/family educated about specific findings on our evaluation and explained exact reasons for admission.  Patient/family educated about clinical situation and time was allowed to answer questions.   Admission team communicated with and agreed with need for admission. Patient admitted. Patient ready to move at this time.     Emergency Department Medication Summary:   Medications  albuterol (PROVENTIL) (2.5 MG/3ML) 0.083% nebulizer solution 2.5 mg (2.5 mg Nebulization Given 05/12/22 1959)  albuterol (PROVENTIL) (2.5 MG/3ML) 0.083% nebulizer solution 20 mg (0 mg Nebulization Stopped 05/12/22 2127)  Chlorhexidine Gluconate Cloth 2 % PADS 6 each (has no administration in time range)  dexamethasone (DECADRON) injection 8 mg (has no administration in time range)  montelukast (SINGULAIR) tablet 10 mg (has no administration in time range)  FLUoxetine (PROZAC) capsule 20 mg (has no administration in time range)  traZODone (DESYREL) tablet 50 mg (has no administration in time range)  budesonide (PULMICORT) nebulizer solution 2 mg (has no administration in time range)  enoxaparin (LOVENOX) injection 40 mg (has no administration in time range)  acetaminophen (TYLENOL) tablet 650 mg (has no administration in time range)    Or  acetaminophen (TYLENOL) suppository 650 mg (has no administration in time range)  ondansetron (ZOFRAN) tablet 4 mg (has no administration in time range)    Or  ondansetron (ZOFRAN) injection 4  mg (has no administration in time range)  ipratropium-albuterol (DUONEB) 0.5-2.5 (3) MG/3ML nebulizer solution 3 mL (has no administration in time range)  acetaminophen (TYLENOL) tablet 1,000 mg (1,000 mg Oral Given 05/12/22 2133)  ketorolac (TORADOL) 15 MG/ML injection 15 mg (15 mg Intravenous Given 05/12/22 2134)         Clinical Impression:  1. Severe asthma with acute exacerbation, unspecified whether persistent      Admit   Final Clinical Impression(s) / ED Diagnoses Final diagnoses:  Severe asthma with acute exacerbation, unspecified whether persistent    Rx / DC Orders ED Discharge Orders     None         Tretha Sciara, MD 05/12/22 2305

## 2022-05-12 NOTE — H&P (Addendum)
History and Physical    Ralph Robinson LNL:[id number] DOB: Mar 04, 1976 DOA: 05/12/2022  PCP: Bartholome Bill, MD  Patient coming from: Home  I have personally briefly reviewed patient's old medical records available.   Chief Complaint: Bronchospasm, severe shortness of breath for 1 day.  HPI: Ralph Robinson is a 47 y.o. male with medical history significant of moderate persistent asthma on chronic maintenance therapy, anxiety and depression presents to the emergency room with quite sudden onset of shortness of breath, respiratory distress, dry cough.  Patient does have moderate asthma, gets asthma attacks about once a month.  He is asymptomatic in between the episodes.  He is followed by pulmonologist and currently maintained on albuterol, trelegy and Singulair.  Today, he was taking classes in his school and suddenly felt like he could not breathe so EMS was called.  He used his inhaler 4 times before calling EMS.  When EMS arrived, patient was with bronchospasm, given DuoNebs, he continued to worsen.  He started on CPAP and brought to the ER.  He also received Solu-Medrol 125 mg and 2 mg of magnesium rider and the route to the ER. Denies any fever or chills.  Denies any inciting factors.  Denies any nausea vomiting, headache or syncopal episodes. Today afternoon before the attack, he had a started having some intermittent sharp pain on his left supraclavicular area for that he went to see his primary care doctor who recommended conservative management.  Currently not hurting but he is scared of the pain coming back.  Denies any injury.  Appetite is fair, no abdominal pain or distention.  Bowel and urine habits are normal. ED Course: Arrived on CPAP, he was given 20 mg/h albuterol continuous inhalation and was able to come off BiPAP to 4 L nasal cannula oxygen.  Currently able to talk in full sentences.  Electrolytes are normal.  COVID-19, influenza and RSV negative.  Review of Systems: all systems  are reviewed and pertinent positive as per HPI otherwise rest are negative.    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 No pertinent surgical history.  Social history   reports that he has never smoked. He has never used smokeless tobacco. He reports that he does not drink alcohol and does not use drugs.  Allergies  Allergen Reactions   Prednisone Rash    Other reaction(s): Other (See Comments) Shooting foot pain, increased appetite, feels drained.    History reviewed. No pertinent family history.   Prior to Admission medications   Medication Sig Start Date End Date Taking? Authorizing Provider  albuterol (ACCUNEB) 0.63 MG/3ML nebulizer solution Take 1 ampule by nebulization 4 (four) times daily. 09/15/19   [provider]  albuterol (PROVENTIL HFA;VENTOLIN HFA) 108 (90 BASE) MCG/ACT inhaler Inhale 2 puffs into the lungs every 6 (six) hours as needed.      [provider]  albuterol (VENTOLIN HFA) 108 (90 Base) MCG/ACT inhaler Inhale 1-2 puffs into the lungs every 6 (six) hours as needed for wheezing or shortness of breath. 02/27/22   Palumbo, April, MD  FLUoxetine (PROZAC) 20 MG capsule Take 1 capsule (20 mg total) by mouth daily. 01/11/22   Mayers, Cari S, PA-C  ipratropium (ATROVENT HFA) 17 MCG/ACT inhaler Inhale 2 puffs into the lungs every 6 (six) hours as needed for wheezing. 04/29/22  Marcello Fennel, PA-C  montelukast (SINGULAIR) 10 MG tablet Take 10 mg by mouth at bedtime.    [provider]  traZODone (DESYREL) 50 MG tablet Take 1 tablet (50 mg total) by mouth at bedtime. 01/11/22   Mayers, Cari S, PA-C  TRELEGY ELLIPTA 200-62.5-25 MCG/ACT AEPB Take 1 puff by mouth daily. 12/01/21   [provider]  triamcinolone ointment (KENALOG) 0.1 % Apply 1 application topically 2 (two) times daily. 09/10/19   [provider]     Physical Exam: Vitals:   05/12/22 2100 05/12/22 2115 05/12/22 2130 05/12/22 2145  BP: (!) 105/59 114/74  134/88  Pulse: (!) 120 (!) 122 (!) 120 (!) 113  Resp: (!) 25  (!) 25   SpO2: 98% 95% 100% 98%    Constitutional: NAD, calm, slightly anxious. Vitals:   05/12/22 2100 05/12/22 2115 05/12/22 2130 05/12/22 2145  BP: (!) 105/59 114/74  134/88  Pulse: (!) 120 (!) 122 (!) 120 (!) 113  Resp: (!) 25  (!) 25   SpO2: 98% 95% 100% 98%   Eyes: PERRL, lids and conjunctivae normal ENMT: Mucous membranes are moist. Posterior pharynx clear of any exudate or lesions.Normal dentition.  Neck: normal, supple, no masses, no thyromegaly Respiratory: clear to auscultation bilaterally, in mild respiratory distress while keeping up conversation.  Mildly increased respiratory effort. No accessory muscle use.  Cardiovascular: Regular rate and rhythm, no murmurs / rubs / gallops. No extremity edema. 2+ pedal pulses. No carotid bruits.  Abdomen: no tenderness, no masses palpated. No hepatosplenomegaly. Bowel sounds positive.  Musculoskeletal: no clubbing / cyanosis. No joint deformity upper and lower extremities. Good ROM, no contractures. Normal muscle tone.  Skin: no rashes, lesions, ulcers. No induration Neurologic: CN 2-12 grossly intact. Sensation intact, DTR normal. Strength 5/5 in all 4.  Psychiatric: Normal judgment and insight. Alert and oriented x 3.  Anxious mood.    Labs on Admission: I have personally reviewed following labs and imaging studies  CBC: Recent Labs  Lab 05/12/22 2005  WBC 10.5  NEUTROABS 5.1  HGB 14.8  HCT 46.1  MCV 83.7  PLT 254   Basic Metabolic Panel: Recent Labs  Lab 05/12/22 2005  NA 137  K 3.6  CL 103  CO2 23  GLUCOSE 114*  BUN 11  CREATININE 0.88  CALCIUM 8.9   GFR: CrCl cannot be calculated (Unknown ideal weight.). Liver Function Tests: Recent Labs  Lab 05/12/22 2005  AST 25  ALT 29  ALKPHOS 46  BILITOT 0.6  PROT 7.4  ALBUMIN 4.1    Recent Labs  Lab 05/12/22 2005  LIPASE 39   No results for input(s): "AMMONIA" in the last 168 hours. Coagulation Profile: No results for input(s): "INR", "PROTIME" in the last 168 hours. Cardiac Enzymes: No results for input(s): "CKTOTAL", "CKMB", "CKMBINDEX", "TROPONINI" in the last 168 hours. BNP (last 3 results) No results for input(s): "PROBNP" in the last 8760 hours. HbA1C: No results for input(s): "HGBA1C" in the last 72 hours. CBG: No results for input(s): "GLUCAP" in the last 168 hours. Lipid Profile: No results for input(s): "CHOL", "HDL", "LDLCALC", "TRIG", "CHOLHDL", "LDLDIRECT" in the last 72 hours. Thyroid Function Tests: No results for input(s): "TSH", "T4TOTAL", "FREET4", "T3FREE", "THYROIDAB" in the last 72 hours. Anemia Panel: No results for input(s): "VITAMINB12", "FOLATE", "FERRITIN", "TIBC", "IRON", "RETICCTPCT" in the last 72 hours. Urine analysis:    Component Value Date/Time   COLORURINE YELLOW 09/08/2010 2010   APPEARANCEUR CLEAR 09/08/2010 2010   LABSPEC 1.016 09/08/2010  2010   PHURINE 6.0 09/08/2010 2010   GLUCOSEU NEGATIVE 09/08/2010 2010   [place] NEGATIVE 09/08/2010 2010   [place] 09/08/2010 2010   [place] 09/08/2010 2010   PROTEINUR NEGATIVE 09/08/2010 2010   UROBILINOGEN 0.2 09/08/2010 2010   NITRITE NEGATIVE 09/08/2010 2010   LEUKOCYTESUR  09/08/2010 2010    NEGATIVE MICROSCOPIC NOT DONE ON URINES WITH NEGATIVE PROTEIN, BLOOD, LEUKOCYTES, NITRITE, OR GLUCOSE <1000 mg/dL.    Radiological Exams on Admission: DG Chest Portable 1 View  Result Date: 05/12/2022 CLINICAL DATA:  Shortness of breath EXAM: PORTABLE CHEST 1 VIEW COMPARISON:  04/28/2022 FINDINGS: The heart size and mediastinal contours are within normal limits. Both lungs are clear. The visualized skeletal structures are unremarkable. IMPRESSION: No active disease. Electronically Signed   By: Donavan Foil M.D.   On: 05/12/2022 20:25    EKG: Independently  reviewed.  Sinus tachycardia.  Right axis deviation.  Assessment/Plan Principal Problem:   Acute respiratory distress Active Problems:   Generalized anxiety disorder with panic attacks   Asthma, chronic obstructive, with acute exacerbation ([place])     1.  Acute respiratory distress as evidenced by bronchospasm, need for BiPAP in the emergency room. Acute asthma exacerbation with underlying chronic intermittent asthma: Agree with admission to monitored unit because of severity of symptoms.  He needed BiPAP in the ER, will keep on stepdown unit tonight. Aggressive bronchodilator therapy, IV steroids(he feels very bad with prednisone but cannot take dexamethasone) , inhalational steroids, scheduled and as needed bronchodilators, deep breathing exercises, incentive spirometry, chest physiotherapy and respiratory therapy consult. BiPAP as needed for bronchospasm and respiratory distress. Supplemental oxygen to keep saturations more than 90%. Continue Singulair.  2.  Generalized anxiety disorder, insomnia: Resume home medications including fluoxetine, trazodone at night.  3. Left supra clavicular pain: probably musculoskeletal, will check D dimer . If significantly elevated , need CTA   DVT prophylaxis: Lovenox subcu Code Status: Full code Family Communication: No family at bedside, wife on the phone. Disposition Plan: Home when stable. Consults called: None Admission status: Observation in the stepdown unit.   Barb Merino MD [facility] Pager [phone]

## 2022-05-12 NOTE — ED Triage Notes (Signed)
Pt reports sudden asthma attack that started 1-2 hours ago. Used his inhaler 4x. When EMS arrived- wheezing in all fields and at 92% RA. Gave duoneb- seemed to worsen- started CPAP. On second duoneb now.   20g L hand. Given 125 of solumedrol and 2g of mag en route.  Resp at bedside.

## 2022-05-12 NOTE — Progress Notes (Addendum)
PT in severe Resp distress upon arrival, Placed PT on bipap, with some relief, started on continuous albuterol neb per MD.

## 2022-05-13 DIAGNOSIS — T380X5A Adverse effect of glucocorticoids and synthetic analogues, initial encounter: Secondary | ICD-10-CM | POA: Diagnosis present

## 2022-05-13 DIAGNOSIS — J4541 Moderate persistent asthma with (acute) exacerbation: Secondary | ICD-10-CM

## 2022-05-13 DIAGNOSIS — F32A Depression, unspecified: Secondary | ICD-10-CM | POA: Diagnosis present

## 2022-05-13 DIAGNOSIS — J45901 Unspecified asthma with (acute) exacerbation: Secondary | ICD-10-CM | POA: Diagnosis not present

## 2022-05-13 DIAGNOSIS — D72829 Elevated white blood cell count, unspecified: Secondary | ICD-10-CM | POA: Diagnosis not present

## 2022-05-13 DIAGNOSIS — M25512 Pain in left shoulder: Secondary | ICD-10-CM | POA: Diagnosis present

## 2022-05-13 DIAGNOSIS — E876 Hypokalemia: Secondary | ICD-10-CM | POA: Diagnosis present

## 2022-05-13 DIAGNOSIS — Z79899 Other long term (current) drug therapy: Secondary | ICD-10-CM | POA: Diagnosis not present

## 2022-05-13 DIAGNOSIS — D7219 Other eosinophilia: Secondary | ICD-10-CM | POA: Diagnosis present

## 2022-05-13 DIAGNOSIS — Z1152 Encounter for screening for COVID-19: Secondary | ICD-10-CM | POA: Diagnosis not present

## 2022-05-13 DIAGNOSIS — R0603 Acute respiratory distress: Secondary | ICD-10-CM

## 2022-05-13 DIAGNOSIS — G4700 Insomnia, unspecified: Secondary | ICD-10-CM | POA: Diagnosis present

## 2022-05-13 DIAGNOSIS — R7303 Prediabetes: Secondary | ICD-10-CM | POA: Diagnosis present

## 2022-05-13 DIAGNOSIS — Z6827 Body mass index (BMI) 27.0-27.9, adult: Secondary | ICD-10-CM | POA: Diagnosis not present

## 2022-05-13 DIAGNOSIS — E663 Overweight: Secondary | ICD-10-CM | POA: Diagnosis present

## 2022-05-13 DIAGNOSIS — J4551 Severe persistent asthma with (acute) exacerbation: Secondary | ICD-10-CM | POA: Diagnosis present

## 2022-05-13 DIAGNOSIS — F411 Generalized anxiety disorder: Secondary | ICD-10-CM | POA: Diagnosis present

## 2022-05-13 DIAGNOSIS — Y92239 Unspecified place in hospital as the place of occurrence of the external cause: Secondary | ICD-10-CM | POA: Diagnosis present

## 2022-05-13 DIAGNOSIS — F410 Panic disorder [episodic paroxysmal anxiety] without agoraphobia: Secondary | ICD-10-CM | POA: Diagnosis present

## 2022-05-13 DIAGNOSIS — Z888 Allergy status to other drugs, medicaments and biological substances status: Secondary | ICD-10-CM | POA: Diagnosis not present

## 2022-05-13 LAB — CBC
HCT: 45.1 % (ref 39.0–52.0)
Hemoglobin: 14.2 g/dL (ref 13.0–17.0)
MCH: 26.7 pg (ref 26.0–34.0)
MCHC: 31.5 g/dL (ref 30.0–36.0)
MCV: 84.9 fL (ref 80.0–100.0)
Platelets: 174 10*3/uL (ref 150–400)
RBC: 5.31 MIL/uL (ref 4.22–5.81)
RDW: 14.4 % (ref 11.5–15.5)
WBC: 15.6 10*3/uL — ABNORMAL HIGH (ref 4.0–10.5)
nRBC: 0 % (ref 0.0–0.2)

## 2022-05-13 LAB — HEMOGLOBIN A1C
Hgb A1c MFr Bld: 5.9 % — ABNORMAL HIGH (ref 4.8–5.6)
Mean Plasma Glucose: 122.63 mg/dL

## 2022-05-13 LAB — BASIC METABOLIC PANEL
Anion gap: 13 (ref 5–15)
BUN: 11 mg/dL (ref 6–20)
CO2: 18 mmol/L — ABNORMAL LOW (ref 22–32)
Calcium: 8.9 mg/dL (ref 8.9–10.3)
Chloride: 104 mmol/L (ref 98–111)
Creatinine, Ser: 1.02 mg/dL (ref 0.61–1.24)
GFR, Estimated: >60 mL/min (ref >60)
Glucose, Bld: 182 mg/dL — ABNORMAL HIGH (ref 70–99)
Potassium: 3.3 mmol/L — ABNORMAL LOW (ref 3.5–5.1)
Sodium: 135 mmol/L (ref 135–145)

## 2022-05-13 LAB — GLUCOSE, CAPILLARY
Glucose-Capillary: 215 mg/dL — ABNORMAL HIGH (ref 70–99)
Glucose-Capillary: 157 mg/dL — ABNORMAL HIGH (ref 70–99)
Glucose-Capillary: 193 mg/dL — ABNORMAL HIGH (ref 70–99)

## 2022-05-13 LAB — HIV ANTIBODY (ROUTINE TESTING W REFLEX): HIV Screen 4th Generation wRfx: NONREACTIVE

## 2022-05-13 LAB — D-DIMER, QUANTITATIVE: D-Dimer, Quant: 0.56 ug/mL-FEU — ABNORMAL HIGH (ref 0.00–0.50)

## 2022-05-13 LAB — MRSA NEXT GEN BY PCR, NASAL: MRSA by PCR Next Gen: NOT DETECTED

## 2022-05-13 MED ORDER — DOXYCYCLINE HYCLATE 100 MG PO TABS
100.0000 mg | ORAL_TABLET | Freq: Two times a day (BID) | ORAL | Status: DC
  Administered 2022-05-13 – 2022-05-15 (×5): 100 mg via ORAL
  Filled 2022-05-13 (×5): qty 1

## 2022-05-13 MED ORDER — GUAIFENESIN 100 MG/5ML PO LIQD: 5.0000 mL | ORAL | Status: DC | PRN

## 2022-05-13 MED ORDER — IPRATROPIUM-ALBUTEROL 0.5-2.5 (3) MG/3ML IN SOLN
3.0000 mL | Freq: Four times a day (QID) | RESPIRATORY_TRACT | Status: DC
  Administered 2022-05-14 – 2022-05-15 (×6): 3 mL via RESPIRATORY_TRACT
  Filled 2022-05-13 (×7): qty 3

## 2022-05-13 MED ORDER — IPRATROPIUM-ALBUTEROL 0.5-2.5 (3) MG/3ML IN SOLN
3.0000 mL | Freq: Three times a day (TID) | RESPIRATORY_TRACT | Status: DC
  Administered 2022-05-13: 3 mL via RESPIRATORY_TRACT
  Filled 2022-05-13: qty 3

## 2022-05-13 MED ORDER — POTASSIUM CHLORIDE CRYS ER 20 MEQ PO TBCR
40.0000 meq | EXTENDED_RELEASE_TABLET | Freq: Once | ORAL | Status: AC
  Administered 2022-05-13: 40 meq via ORAL
  Filled 2022-05-13: qty 2

## 2022-05-13 MED ORDER — GUAIFENESIN ER 600 MG PO TB12
600.0000 mg | ORAL_TABLET | Freq: Two times a day (BID) | ORAL | Status: DC
  Administered 2022-05-13 – 2022-05-15 (×5): 600 mg via ORAL
  Filled 2022-05-13 (×5): qty 1

## 2022-05-13 MED ORDER — INSULIN ASPART 100 UNIT/ML IJ SOLN
0.0000 [IU] | Freq: Three times a day (TID) | INTRAMUSCULAR | Status: DC
  Administered 2022-05-13: 3 [IU] via SUBCUTANEOUS
  Administered 2022-05-13 (×2): 2 [IU] via SUBCUTANEOUS
  Administered 2022-05-14: 1 [IU] via SUBCUTANEOUS
  Administered 2022-05-14 (×2): 3 [IU] via SUBCUTANEOUS

## 2022-05-13 MED ORDER — SERTRALINE HCL 100 MG PO TABS
100.0000 mg | ORAL_TABLET | Freq: Every day | ORAL | Status: DC
  Administered 2022-05-13 – 2022-05-15 (×3): 100 mg via ORAL
  Filled 2022-05-13 (×3): qty 1

## 2022-05-13 MED ORDER — DEXAMETHASONE 2 MG PO TABS
4.0000 mg | ORAL_TABLET | Freq: Two times a day (BID) | ORAL | Status: DC
  Administered 2022-05-13 – 2022-05-15 (×4): 4 mg via ORAL
  Filled 2022-05-13 (×4): qty 2

## 2022-05-13 MED ORDER — IPRATROPIUM-ALBUTEROL 0.5-2.5 (3) MG/3ML IN SOLN
3.0000 mL | RESPIRATORY_TRACT | Status: DC
  Administered 2022-05-13 (×3): 3 mL via RESPIRATORY_TRACT
  Filled 2022-05-13 (×3): qty 3

## 2022-05-13 NOTE — Progress Notes (Signed)
PROGRESS NOTE   Ralph Robinson  [id number]    DOB: 19-Feb-1976    DOA: 05/12/2022  PCP: Bartholome Bill, MD   I have briefly reviewed patients previous medical records in [facility].  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  EMS noted oxygen saturation of 92% on room air, provided DuoNebs, magnesium and Solu-Medrol but continued to worsen, started on CPAP and brought to the ER.  In ER briefly on BiPAP then transition to [place] oxygen.  Admitted to stepdown unit for acute respiratory distress due to asthma exacerbation.  Had another worsening episode on 2/9 morning.  Pulmonology consulted for assistance and optimization.   Assessment & Plan:  Principal Problem:   Acute respiratory distress Active Problems:   Generalized anxiety disorder with panic attacks   Asthma, chronic obstructive, with acute exacerbation (HCC)   Asthma exacerbation with acute respiratory distress: Has seen pulmonology in the past but not recently.  Lifelong non-smoker.  Reports that he has usually about 2-3 episodes of asthma exacerbations per month and more so during the winter season.  He also confirms that even between episodes, he has ongoing wheezing.  Never been intubated.  Found to be acutely dyspneic and in respiratory distress by EMS, briefly on BiPAP in ED and then transitioned to [place] oxygen and admitted to stepdown unit.  Early morning on 2/9, worsening dyspnea, respiratory distress, unable to speak, improved with multiple albuterol nebs.  Reportedly tolerated Decadron and Solu-Medrol but has late reaction to prednisone i.e. severe itching after the course is completed even warranting ED visit.  S/p IV Solu-Medrol,  IV magnesium and nebs by EMS.  Here placed on Decadron IV 8 mg every 12 hours (defer to pulmonology if they want to increase the dose or change to Solu-Medrol).  Continue scheduled DuoNeb, as needed albuterol nebs, Pulmicort nebs, Singulair.  Added Mucinex and humidified oxygen to loosen up secretions.  Added short course of doxycycline due to productive cough/possible acute bronchitis.  Pulmonology consulted.  As needed BiPAP.  Remains in stepdown unit.  Influenza A, B, RSV, SARS coronavirus 2 by RT-PCR: Negative.  Chest x-ray negative.  Hypokalemia: Replace and follow.  Leukocytosis: Likely related to steroids and stress response.  Reported prediabetes: No recent A1c, check A1c.  SSI.  Anxiety and depression: Continue home meds.  Body mass index is 27.79 kg/m.   DVT prophylaxis: enoxaparin (LOVENOX) injection 40 mg Start: 05/13/22 1000     Code Status: Full Code:  Family Communication: None at bedside. Disposition:  Status is: Observation The patient will require care spanning > 2 midnights and should be moved to inpatient because: Ongoing acute respiratory distress episodes due to asthma exacerbation requiring intensive evaluation, monitoring and management.     Consultants:   Pulmonology  Procedures:     Antimicrobials:   Doxycycline 2/9 >   Subjective:  Most of history as noted above.  As per patient and night nursing, overnight was on 4 L/min [place] oxygen and relatively stable.  Early this morning, had worsening dyspnea, unable to speak which finally improved after a couple of rounds of albuterol nebs.  Patient states that he had dry secretions, was unable to bring up the secretions, cough with intermittent yellow  sputum.  No chest pain reported.  No recent travel, asymmetric leg swelling or pain.  Objective:   Vitals:   05/12/22 2329 05/13/22 0000 05/13/22 0329 05/13/22 0359  BP:  120/63    Pulse:  (!) 120    Resp:  (!) 21    Temp: 98.1 F (36.7 C)   97.7 F (36.5  C)  TempSrc: Oral   Oral  SpO2:  96% 95%   Weight: 78.1 kg     Height: 5' 6"$  (1.676 m)       General exam: Young male, moderately built and nourished lying propped up in bed.  Mild increased work of breathing.  Undergoing nebulization.  RT at bedside. Respiratory system: Diminished breath sounds bilaterally with scattered bilateral medium pitched expiratory rhonchi.  No crackles.  Currently without accessory muscle use.  Able to speak in full sentences. Cardiovascular system: S1 & S2 heard, RRR. No JVD, murmurs, rubs, gallops or clicks. No pedal edema.  Telemetry personally reviewed: Sinus tachycardia ranging in the 100s-120s. Gastrointestinal system: Abdomen is nondistended, soft and nontender. No organomegaly or masses felt. Normal bowel sounds heard. Central nervous system: Alert and oriented. No focal neurological deficits. Extremities: Symmetric 5 x 5 power. Skin: No rashes, lesions or ulcers Psychiatry: Judgement and insight appear normal. Mood & affect appropriate.     Data Reviewed:   I have personally reviewed following labs and imaging studies   CBC: Recent Labs  Lab 05/12/22 2005 05/13/22 0025  WBC 10.5 15.6*  NEUTROABS 5.1  --   HGB 14.8 14.2  HCT 46.1 45.1  MCV 83.7 84.9  PLT 168 [id number]    Basic Metabolic Panel: Recent Labs  Lab 05/12/22 2005 05/13/22 0025  NA 137 135  K 3.6 3.3*  CL 103 104  CO2 23 18*  GLUCOSE 114* 182*  BUN 11 11  CREATININE 0.88 1.02  CALCIUM 8.9 8.9    Liver Function Tests: Recent Labs  Lab 05/12/22 2005  AST 25  ALT 29  ALKPHOS 46  BILITOT 0.6  PROT 7.4  ALBUMIN 4.1    CBG: No results for input(s): "GLUCAP" in the last 168 hours.  Microbiology Studies:   Recent Results (from the past 240 hour(s))  Resp panel by RT-PCR (RSV, Flu A&B, Covid) Anterior Nasal Swab     Status: None   Collection Time: 05/12/22  8:05 PM   Specimen: Anterior Nasal Swab  Result Value Ref Range Status   SARS Coronavirus 2 by RT PCR  NEGATIVE NEGATIVE Final    Comment: (NOTE) SARS-CoV-2 target nucleic acids are NOT DETECTED.  The SARS-CoV-2 RNA is generally detectable in upper respiratory specimens during the acute phase of infection. The lowest concentration of SARS-CoV-2 viral copies this assay can detect is 138 copies/mL. A negative result does not preclude SARS-Cov-2 infection and should not be used as the sole basis for treatment or other patient management decisions. A negative result may occur with  improper specimen collection/handling, submission of specimen other than nasopharyngeal swab, presence of viral mutation(s) within the areas targeted by this assay, and inadequate number of viral copies(<138 copies/mL). A negative result must be combined with clinical observations, patient history, and epidemiological information. The expected result is Negative.  Fact Sheet for Patients:  EntrepreneurPulse.com.au  Fact Sheet for Healthcare Providers:  IncredibleEmployment.be  This test is no t yet approved or cleared by the [place] FDA and  has been authorized for detection and/or diagnosis of SARS-CoV-2 by FDA under an Emergency Use Authorization (  EUA). This EUA will remain  in effect (meaning this test can be used) for the duration of the COVID-19 declaration under Section 564(b)(1) of the Act, 21 U.S.C.section 360bbb-3(b)(1), unless the authorization is terminated  or revoked sooner.       Influenza A by PCR NEGATIVE NEGATIVE Final   Influenza B by PCR NEGATIVE NEGATIVE Final    Comment: (NOTE) The Xpert Xpress SARS-CoV-2/FLU/RSV plus assay is intended as an aid in the diagnosis of influenza from Nasopharyngeal swab specimens and should not be used as a sole basis for treatment. Nasal washings and aspirates are unacceptable for Xpert Xpress SARS-CoV-2/FLU/RSV testing.  Fact Sheet for Patients: EntrepreneurPulse.com.au  Fact Sheet for  Healthcare Providers: IncredibleEmployment.be  This test is not yet approved or cleared by the [place] FDA and has been authorized for detection and/or diagnosis of SARS-CoV-2 by FDA under an Emergency Use Authorization (EUA). This EUA will remain in effect (meaning this test can be used) for the duration of the COVID-19 declaration under Section 564(b)(1) of the Act, 21 U.S.C. section 360bbb-3(b)(1), unless the authorization is terminated or revoked.     Resp Syncytial Virus by PCR NEGATIVE NEGATIVE Final    Comment: (NOTE) Fact Sheet for Patients: EntrepreneurPulse.com.au  Fact Sheet for Healthcare Providers: IncredibleEmployment.be  This test is not yet approved or cleared by the [place] FDA and has been authorized for detection and/or diagnosis of SARS-CoV-2 by FDA under an Emergency Use Authorization (EUA). This EUA will remain in effect (meaning this test can be used) for the duration of the COVID-19 declaration under Section 564(b)(1) of the Act, 21 U.S.C. section 360bbb-3(b)(1), unless the authorization is terminated or revoked.  Performed at [facility], [place] [address], [place], [place] [address]   MRSA Next Gen by PCR, Nasal     Status: None   Collection Time: 05/12/22 10:25 PM   Specimen: Nasal Mucosa; Nasal Swab  Result Value Ref Range Status   MRSA by PCR Next Gen NOT DETECTED NOT DETECTED Final    Comment: (NOTE) The GeneXpert MRSA Assay (FDA approved for NASAL specimens only), is one component of a comprehensive MRSA colonization surveillance program. It is not intended to diagnose MRSA infection nor to guide or monitor treatment for MRSA infections. Test performance is not FDA approved in patients less than 81 years old. Performed at [facility], [place] [address], [place], [place] [address]     Radiology Studies:  DG Chest Portable 1 View  Result  Date: 05/12/2022 CLINICAL DATA:  Shortness of breath EXAM: PORTABLE CHEST 1 VIEW COMPARISON:  04/28/2022 FINDINGS: The heart size and mediastinal contours are within normal limits. Both lungs are clear. The visualized skeletal structures are unremarkable. IMPRESSION: No active disease. Electronically Signed   By: Donavan Foil M.D.   On: 05/12/2022 20:25    Scheduled Meds:    budesonide (PULMICORT) nebulizer solution  2 mg Nebulization Q12H   Chlorhexidine Gluconate Cloth  6 each Topical Daily   dexamethasone (DECADRON) injection  8 mg Intravenous Q12H   enoxaparin (LOVENOX) injection  40 mg Subcutaneous Q24H   FLUoxetine  20 mg Oral Daily   insulin aspart  0-9 Units Subcutaneous TID WC   ipratropium-albuterol  3 mL Nebulization TID   montelukast  10 mg Oral QHS   potassium chloride  40 mEq Oral Once   traZODone  50 mg Oral QHS    Continuous Infusions:     LOS: 0 days     Everlene Farrier  Algis Liming, MD,  FACP, [place], [place], [place], [facility]   [facility] [place]     To contact the attending provider between 7A-7P or the covering provider during after hours 7P-7A, please log into the web site [email or web address] and access using universal [place] password for that web site. If you do not have the password, please call the hospital operator.  05/13/2022, 7:44 AM

## 2022-05-13 NOTE — ED Notes (Signed)
This RN gave the Decadron before transferring pt upstairs. This RN forgot to click off in [facility]. This RN made the nurses upstairs aware

## 2022-05-13 NOTE — Consult Note (Signed)
Ralph Robinson, MRN:  [id number], DOB:  10-11-1975, LOS: 0 ADMISSION DATE:  05/12/2022, CONSULTATION DATE: 05/13/2022 REFERRING MD: Dr. Algis Liming,  CHIEF COMPLAINT: Asthma exacerbation  History of Present Illness:  Asked to see patient for asthma exacerbation  3-4 exacerbations a month with frequent ED visits Had COVID about 3 to 4 months ago which made his asthma much worse.  Has been on Trelegy for about a year to a year and a half, felt Trelegy did help significantly when he started using it Requires albuterol on a daily basis.  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 travel  Married with 2 children, children have allergies, mother may have had asthma  Pertinent  Medical History   Past Medical History:  Diagnosis Date   Asthma    Paresthesia of both feet    PPD positive    hepatitis due to [place] [facility] Events: Including procedures, antibiotic start and stop dates in addition to other pertinent events   Chest x-ray 05/12/2022-no acute infiltrate-reviewed by myself  Interim History / Subjective:  Middle-age, does not appear to be an extremis, does appear anxious  Objective   Blood pressure 120/63, pulse (!) 120, temperature 97.7 F (36.5 C), temperature source Oral, resp. rate (!) 21, height 5' 6"$  (1.676 m), weight 78.1 kg, SpO2 95 %.    FiO2 (%):  [30 %] 30 %  No intake or output data in the 24 hours ending 05/13/22 [id number] Filed Weights   05/12/22 2329  Weight: 78.1 kg    Examination: General: Well kempt, does not appear to be in distress HENT: Moist oral mucosa Lungs: Some wheezing Cardiovascular: S1-S2 appreciated Abdomen: Bowel sounds appreciated Extremities: No clubbing, no edema Neuro: Alert and oriented x 3 GU:   Resolved Hospital Problem list     Assessment  & Plan:  Severe persistent asthma with exacerbation -Bronchodilators, steroids, short course of antibiotics  Severe anxiety -Patient is quite sure he is on sertraline at 100 mg, not Prozac   Asthma has remained uncontrolled -May be a candidate for biologic -Needs further workup -Eosinophils of 600 Switch Decadron to p.o.  Will need further workup as outpatient May be someone that needs biologic to get more stable IgE levels will not be accurate while patient is on steroids   Best Practice (right click and "Reselect all SmartList Selections" daily)   Diet/type: Regular consistency (see orders) DVT prophylaxis: LMWH GI prophylaxis: N/A Lines: N/A Foley:  N/A Code Status:  full code Last date of multidisciplinary goals of care discussion [pending]  Labs   CBC: Recent Labs  Lab 05/12/22 2005 05/13/22 0025  WBC 10.5 15.6*  NEUTROABS 5.1  --   HGB 14.8 14.2  HCT 46.1 45.1  MCV 83.7 84.9  PLT 168 [id number]    Basic Metabolic Panel: Recent Labs  Lab 05/12/22 2005 05/13/22 0025  NA 137 135  K 3.6 3.3*  CL 103 104  CO2 23 18*  GLUCOSE 114* 182*  BUN 11 11  CREATININE 0.88 1.02  CALCIUM 8.9 8.9   GFR: Estimated Creatinine Clearance: 89 mL/min (by C-G formula based on SCr of 1.02 mg/dL). Recent Labs  Lab 05/12/22 2005 05/13/22 0025  WBC 10.5 15.6*    Liver Function Tests: Recent Labs  Lab 05/12/22 2005  AST 25  ALT 29  ALKPHOS 46  BILITOT 0.6  PROT 7.4  ALBUMIN 4.1   Recent Labs  Lab 05/12/22 2005  LIPASE 39   No results for input(s): "AMMONIA" in the last 168 hours.  ABG    Component Value Date/Time   TCO2 25 02/27/2022 0424     Coagulation Profile: No results for input(s): "INR", "PROTIME" in the last 168 hours.  Cardiac Enzymes: No results for input(s): "CKTOTAL", "CKMB", "CKMBINDEX", "TROPONINI" in the last 168 hours.  HbA1C: No results found for: "HGBA1C"  CBG: No results for input(s): "GLUCAP" in the last 168 hours.  Review of  Systems:   Awake alert interactive Some chest discomfort  Past Medical History:  He,  has a past medical history of Asthma, Paresthesia of both feet, PPD positive, and Vertigo.   Surgical History:  History reviewed. No pertinent surgical history.   Social History:   reports that he has never smoked. He has never used smokeless tobacco. He reports that he does not drink alcohol and does not use drugs.   Family History:  His family history is not on file.   Allergies Allergies  Allergen Reactions   Beef-Derived Products    Prednisone Rash    Other reaction(s): Other (See Comments) Shooting foot pain, increased appetite, feels drained.    Sherrilyn Rist, MD [facility] Pager: See Shea Evans

## 2022-05-13 NOTE — Progress Notes (Signed)
Pt called out and this RN responded to find pt urgently SOB and unable to speak. This RN called respiratory to bring a bipap and gave pt two back to back albuterol nebs, at which point pt was able to speak and take deep breaths again.

## 2022-05-14 DIAGNOSIS — D72829 Elevated white blood cell count, unspecified: Secondary | ICD-10-CM | POA: Diagnosis not present

## 2022-05-14 DIAGNOSIS — R0603 Acute respiratory distress: Secondary | ICD-10-CM | POA: Diagnosis not present

## 2022-05-14 DIAGNOSIS — J4551 Severe persistent asthma with (acute) exacerbation: Secondary | ICD-10-CM

## 2022-05-14 LAB — BASIC METABOLIC PANEL
Anion gap: 10 (ref 5–15)
BUN: 16 mg/dL (ref 6–20)
CO2: 23 mmol/L (ref 22–32)
Calcium: 9.2 mg/dL (ref 8.9–10.3)
Chloride: 103 mmol/L (ref 98–111)
Creatinine, Ser: 0.98 mg/dL (ref 0.61–1.24)
GFR, Estimated: >60 mL/min (ref >60)
Glucose, Bld: 133 mg/dL — ABNORMAL HIGH (ref 70–99)
Potassium: 3.9 mmol/L (ref 3.5–5.1)
Sodium: 136 mmol/L (ref 135–145)

## 2022-05-14 LAB — GLUCOSE, CAPILLARY
Glucose-Capillary: 145 mg/dL — ABNORMAL HIGH (ref 70–99)
Glucose-Capillary: 201 mg/dL — ABNORMAL HIGH (ref 70–99)
Glucose-Capillary: 153 mg/dL — ABNORMAL HIGH (ref 70–99)
Glucose-Capillary: 191 mg/dL — ABNORMAL HIGH (ref 70–99)

## 2022-05-14 LAB — CBC
HCT: 39.7 % (ref 39.0–52.0)
Hemoglobin: 12.9 g/dL — ABNORMAL LOW (ref 13.0–17.0)
MCH: 27.1 pg (ref 26.0–34.0)
MCHC: 32.5 g/dL (ref 30.0–36.0)
MCV: 83.4 fL (ref 80.0–100.0)
Platelets: 171 10*3/uL (ref 150–400)
RBC: 4.76 MIL/uL (ref 4.22–5.81)
RDW: 14.9 % (ref 11.5–15.5)
WBC: 19.1 10*3/uL — ABNORMAL HIGH (ref 4.0–10.5)
nRBC: 0 % (ref 0.0–0.2)

## 2022-05-14 LAB — TSH: TSH: 0.071 u[IU]/mL — ABNORMAL LOW (ref 0.350–4.500)

## 2022-05-14 LAB — MAGNESIUM: Magnesium: 2.4 mg/dL (ref 1.7–2.4)

## 2022-05-14 MED ORDER — INSULIN ASPART 100 UNIT/ML IJ SOLN
0.0000 [IU] | Freq: Three times a day (TID) | INTRAMUSCULAR | Status: DC
  Administered 2022-05-15 (×2): 1 [IU] via SUBCUTANEOUS

## 2022-05-14 MED ORDER — DICLOFENAC SODIUM 1 % EX GEL
4.0000 g | Freq: Four times a day (QID) | CUTANEOUS | Status: DC
  Administered 2022-05-14 – 2022-05-15 (×4): 4 g via TOPICAL
  Filled 2022-05-14: qty 100

## 2022-05-14 MED ORDER — HYDROXYZINE HCL 10 MG PO TABS
10.0000 mg | ORAL_TABLET | Freq: Three times a day (TID) | ORAL | Status: DC | PRN
  Administered 2022-05-14: 10 mg via ORAL
  Filled 2022-05-14: qty 1

## 2022-05-14 NOTE — Consult Note (Signed)
   NAMETaquan Robinson, MRN:  [id number], DOB:  1975/05/10, LOS: 1 ADMISSION DATE:  05/12/2022, CONSULTATION DATE: 05/13/2022 REFERRING MD: Dr. Algis Liming,  CHIEF COMPLAINT: Asthma exacerbation  History of Present Illness:  Asked to see patient for asthma exacerbation  3-4 exacerbations a month with frequent ED visits Had COVID about 3 to 4 months ago which made his asthma much worse.  Has been on Trelegy for about a year to a year and a half, felt Trelegy did help significantly when he started using it Requires albuterol on a daily basis.  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 travel  Married with 2 children, children have allergies, mother may have had asthma  Pertinent  Medical History   Past Medical History:  Diagnosis Date   Asthma    Paresthesia of both feet    PPD positive    hepatitis due to [place] [facility] Events: Including procedures, antibiotic start and stop dates in addition to other pertinent events   Chest x-ray 05/12/2022-no acute infiltrate effusion or edema  Interim History / Subjective:  On room air with low-normal sats <94% Desats to 89% with brief conversation <5 min Objective   Blood pressure (!) 101/50, pulse 97, temperature 97.8 F (36.6 C), temperature source Oral, resp. rate 17, height 5\' 6"  (1.676 m), weight 78.1 kg, SpO2 91 %.        Intake/Output Summary (Last 24 hours) at 05/14/2022 0852 Last data filed at 05/14/2022 0420 Gross per 24 hour  Intake --  Output 300 ml  Net -300 ml   Filed Weights   05/12/22 2329  Weight: 78.1 kg    Physical Exam: General: Well-appearing, no acute distress HENT: [place], AT, OP clear, MMM Eyes: EOMI, no scleral icterus Respiratory: Diminished air entry. No wheezing Cardiovascular: RRR, -M/R/G, no JVD GI: BS+, soft,  nontender Extremities:-Edema,-tenderness Neuro: AAO x4, CNII-XII grossly intact Psych: Normal mood, normal affect  WBC 19.1 BMET acceptable and unremarkable    Resolved Hospital Problem list     Assessment & Plan:  Severe persistent asthma with exacerbation Peripheral eosinophilia -Nebulizer: Pulmicort BID and Duonebs -Decadron -Doxycycline -Singulair -Will need to establish with Pulmonary outpatient for follow-up to consider biologic   Severe anxiety -Setraline 100 mg daily -Atarax started by primary team 2/10  With nadir O2 89% with speech, not ready to discharge home from pulmonary standpoint and would benefit from above management. Will reassess again in 24-48 hours  Best Practice (right click and "Reselect all SmartList Selections" daily)   Diet/type: Regular consistency (see orders) DVT prophylaxis: LMWH GI prophylaxis: N/A Lines: N/A Foley:  N/A Code Status:  full code Last date of multidisciplinary goals of care discussion [pending]  Care Time: 35 min  Rodman Pickle, M.D. [facility] Pulmonary/Critical Care Medicine 05/14/2022 8:52 AM   See Amion for personal pager For hours between 7 PM to 7 AM, please call Elink for urgent questions

## 2022-05-14 NOTE — Progress Notes (Signed)
  Transition of Care (TOC) Screening Note   Patient Details  Name: [name] Date of Birth: 09/24/1975   Transition of Care [facility]) CM/SW Contact:    Henrietta Dine, RN Phone Number: 05/14/2022, 12:31 PM    Transition of Care Department [facility]) has reviewed patient and no TOC needs have been identified at this time. We will continue to monitor patient advancement through interdisciplinary progression rounds. If new patient transition needs arise, please place a TOC consult.

## 2022-05-14 NOTE — Progress Notes (Signed)
PROGRESS NOTE   Ralph Robinson  [id number]    DOB: 1975-11-13    DOA: 05/12/2022  PCP: Bartholome Bill, MD   I have briefly reviewed patients previous medical [facility].  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  EMS noted oxygen saturation of 92% on room air, provided DuoNebs, magnesium and Solu-Medrol but continued to worsen, started on CPAP and brought to the ER.  In ER briefly on BiPAP then transition to [place] oxygen.  Admitted to stepdown unit for acute respiratory distress due to asthma exacerbation.  Had another worsening episode on 2/9 morning.  Pulmonology consulted for assistance and optimization.   Assessment & Plan:  Principal Problem:   Acute respiratory distress Active Problems:   Generalized anxiety disorder with panic attacks   Asthma, chronic obstructive, with acute exacerbation (HCC)   Severe persistent asthma exacerbation with acute respiratory distress: Has seen pulmonology in the past but not recently.  Lifelong non-smoker.  Reports that he has usually about 2-3 episodes of asthma exacerbations per month and more so during the winter season.  He also confirms that even between episodes, he has ongoing wheezing.  Never been intubated.  Found to be acutely dyspneic and in respiratory distress by EMS, briefly on BiPAP in ED and then transitioned to [place] oxygen and admitted to stepdown unit.  Early morning on 2/9, worsening dyspnea, respiratory distress, unable to speak, improved with multiple albuterol nebs.  Reportedly tolerated Decadron and Solu-Medrol but has late reaction to prednisone i.e. severe itching after the course is completed even warranting ED visit.   S/p IV Solu-Medrol, IV magnesium and nebs by EMS.  Here placed on Decadron IV 8 mg every 12 hours which was changed by PCCM to 4 mg twice daily.  Continue scheduled DuoNeb, as needed albuterol nebs, Pulmicort nebs, Singulair.  Added Mucinex and humidified oxygen to loosen up secretions.  Added short course of doxycycline x 5 days due to productive cough/possible acute bronchitis.  Pulmonology consulted.  As needed BiPAP.  Remains in stepdown unit.  Influenza A, B, RSV, SARS coronavirus 2 by RT-PCR: Negative.  Chest x-ray negative. - Pulmonology consultation 2/9 appreciated.  Management essentially as noted above.  Indicated that he may need Biologics when he gets stable with workup for same as outpatient.  Although somewhat better, ongoing diminished bilateral air entry with wheezing.  Continue current management.  Added flutter valve which she says helps him at home to clear secretions.  Discussed with PCCM today who will follow.  Hypokalemia: Replaced.  Magnesium normal.  Leukocytosis: Likely related to steroids and stress response.  Is on oral doxycycline.  Prediabetes: A1c 5.9.  Outpatient follow-up.  Continue SSI.  Anxiety and depression: Continue home meds/sertraline.  Body mass index is 27.79 kg/m./Overweight   DVT prophylaxis: enoxaparin (LOVENOX) injection 40 mg Start: 05/13/22 1000     Code Status: Full Code:  Family Communication: None at bedside. Disposition:  Inpatient appropriate due to ongoing asthma exacerbation with high risk for deterioration.  Remains in stepdown.     Consultants:   Pulmonology  Procedures:     Antimicrobials:   Doxycycline 2/9 >   Subjective:  States that his dyspnea has resolved.  However has  only ambulated in the room.  Reports ongoing wheezing and unable to bring up phlegm.  Feels like if he goes home and has a hot shower and uses his device (suspect a flutter valve), he should be able to bring up secretions.  Objective:   Vitals:    05/14/22 0419 05/14/22 0800 05/14/22 0822 05/14/22 0823  BP:      Pulse:      Resp:      Temp: (!) 97.5 F (36.4 C) 97.8 F (36.6 C)    TempSrc: Oral Oral    SpO2:   90% 91%  Weight:      Height:        General exam: Young male, moderately built and nourished sitting up in bed, looks to have some audible wheezing and DOE even while speaking. Respiratory system: Although somewhat improved compared to yesterday, still with diminished breath sounds bilaterally, harsh breath sounds with scattered few medium pitched expiratory rhonchi.  No crackles.  Mild DOE Cardiovascular system: S1 & S2 heard, RRR. No JVD, murmurs, rubs, gallops or clicks. No pedal edema.  Telemetry personally reviewed: SR in the 90s-ST in the 110s. Gastrointestinal system: Abdomen is nondistended, soft and nontender. No organomegaly or masses felt. Normal bowel sounds heard. Central nervous system: Alert and oriented. No focal neurological deficits. Extremities: Symmetric 5 x 5 power. Skin: No rashes, lesions or ulcers Psychiatry: Judgement and insight appear normal. Mood & affect appropriate.     Data Reviewed:   I have personally reviewed following labs and imaging studies   CBC: Recent Labs  Lab 05/12/22 2005 05/13/22 0025 05/14/22 0312  WBC 10.5 15.6* 19.1*  NEUTROABS 5.1  --   --   HGB 14.8 14.2 12.9*  HCT 46.1 45.1 39.7  MCV 83.7 84.9 83.4  PLT 168 174 [id number]    Basic Metabolic Panel: Recent Labs  Lab 05/12/22 2005 05/13/22 0025 05/14/22 0312  NA 137 135 136  K 3.6 3.3* 3.9  CL 103 104 103  CO2 23 18* 23  GLUCOSE 114* 182* 133*  BUN 11 11 16  $ CREATININE 0.88 1.02 0.98  CALCIUM 8.9 8.9 9.2  MG  --   --  2.4    Liver Function Tests: Recent Labs  Lab 05/12/22 2005  AST 25  ALT 29  ALKPHOS 46  BILITOT 0.6  PROT 7.4  ALBUMIN 4.1    CBG: Recent Labs  Lab 05/13/22 1238 05/13/22 1745 05/14/22 0746  GLUCAP 157* 193* 145*    Microbiology Studies:   Recent Results (from the  past 240 hour(s))  Resp panel by RT-PCR (RSV, Flu A&B, Covid) Anterior Nasal Swab     Status: None   Collection Time: 05/12/22  8:05 PM   Specimen: Anterior Nasal Swab  Result Value Ref Range Status   SARS Coronavirus 2 by RT PCR NEGATIVE NEGATIVE Final    Comment: (NOTE) SARS-CoV-2 target nucleic acids are NOT DETECTED.  The SARS-CoV-2 RNA is generally detectable in upper respiratory specimens during the acute phase of infection. The lowest concentration of SARS-CoV-2 viral copies this assay can detect is 138 copies/mL. A negative result does not preclude SARS-Cov-2 infection and should not be used as the sole basis for treatment or other patient management decisions. A negative result may occur with  improper specimen collection/handling, submission of specimen other than nasopharyngeal swab, presence of viral mutation(s) within the areas targeted by this assay, and inadequate number of viral copies(<138 copies/mL). A negative result must be combined with clinical observations,  patient history, and epidemiological information. The expected result is Negative.  Fact Sheet for Patients:  EntrepreneurPulse.com.au  Fact Sheet for Healthcare Providers:  IncredibleEmployment.be  This test is no t yet approved or cleared by the [place] FDA and  has been authorized for detection and/or diagnosis of SARS-CoV-2 by FDA under an Emergency Use Authorization (EUA). This EUA will remain  in effect (meaning this test can be used) for the duration of the COVID-19 declaration under Section 564(b)(1) of the Act, 21 U.S.C.section 360bbb-3(b)(1), unless the authorization is terminated  or revoked sooner.       Influenza A by PCR NEGATIVE NEGATIVE Final   Influenza B by PCR NEGATIVE NEGATIVE Final    Comment: (NOTE) The Xpert Xpress SARS-CoV-2/FLU/RSV plus assay is intended as an aid in the diagnosis of influenza from Nasopharyngeal swab specimens  and should not be used as a sole basis for treatment. Nasal washings and aspirates are unacceptable for Xpert Xpress SARS-CoV-2/FLU/RSV testing.  Fact Sheet for Patients: EntrepreneurPulse.com.au  Fact Sheet for Healthcare Providers: IncredibleEmployment.be  This test is not yet approved or cleared by the [place] FDA and has been authorized for detection and/or diagnosis of SARS-CoV-2 by FDA under an Emergency Use Authorization (EUA). This EUA will remain in effect (meaning this test can be used) for the duration of the COVID-19 declaration under Section 564(b)(1) of the Act, 21 U.S.C. section 360bbb-3(b)(1), unless the authorization is terminated or revoked.     Resp Syncytial Virus by PCR NEGATIVE NEGATIVE Final    Comment: (NOTE) Fact Sheet for Patients: EntrepreneurPulse.com.au  Fact Sheet for Healthcare Providers: IncredibleEmployment.be  This test is not yet approved or cleared by the [place] FDA and has been authorized for detection and/or diagnosis of SARS-CoV-2 by FDA under an Emergency Use Authorization (EUA). This EUA will remain in effect (meaning this test can be used) for the duration of the COVID-19 declaration under Section 564(b)(1) of the Act, 21 U.S.C. section 360bbb-3(b)(1), unless the authorization is terminated or revoked.  Performed at [facility], [place] [address], [place], [place] [address]   MRSA Next Gen by PCR, Nasal     Status: None   Collection Time: 05/12/22 10:25 PM   Specimen: Nasal Mucosa; Nasal Swab  Result Value Ref Range Status   MRSA by PCR Next Gen NOT DETECTED NOT DETECTED Final    Comment: (NOTE) The GeneXpert MRSA Assay (FDA approved for NASAL specimens only), is one component of a comprehensive MRSA colonization surveillance program. It is not intended to diagnose MRSA infection nor to guide or monitor treatment for MRSA  infections. Test performance is not FDA approved in patients less than 66 years old. Performed at [facility], [place] [address], [place], [place] [address]     Radiology Studies:  DG Chest Portable 1 View  Result Date: 05/12/2022 CLINICAL DATA:  Shortness of breath EXAM: PORTABLE CHEST 1 VIEW COMPARISON:  04/28/2022 FINDINGS: The heart size and mediastinal contours are within normal limits. Both lungs are clear. The visualized skeletal structures are unremarkable. IMPRESSION: No active disease. Electronically Signed   By: Donavan Foil M.D.   On: 05/12/2022 20:25    Scheduled Meds:    budesonide (PULMICORT) nebulizer solution  2 mg Nebulization Q12H   Chlorhexidine Gluconate Cloth  6 each Topical Daily   dexamethasone  4 mg Oral Q12H   doxycycline  100 mg Oral Q12H   enoxaparin (LOVENOX) injection  40 mg Subcutaneous Q24H   guaiFENesin  600 mg Oral BID   insulin aspart  0-9 Units Subcutaneous TID WC   ipratropium-albuterol  3 mL Nebulization Q6H   montelukast  10 mg Oral QHS   sertraline  100 mg Oral Daily   traZODone  50 mg Oral QHS    Continuous Infusions:     LOS: 1 day     Vernell Leep, MD,  FACP, FHM, SFHM, [facility], [place]     To contact the attending provider between 7A-7P or the covering provider during after hours 7P-7A, please log into the web site [email or web address] and access using universal [place] password for that web site. If you do not have the password, please call the hospital operator.  05/14/2022, 8:38 AM

## 2022-05-15 ENCOUNTER — Telehealth: Payer: Self-pay | Admitting: Pulmonary Disease

## 2022-05-15 DIAGNOSIS — R0603 Acute respiratory distress: Secondary | ICD-10-CM | POA: Diagnosis not present

## 2022-05-15 DIAGNOSIS — J45901 Unspecified asthma with (acute) exacerbation: Secondary | ICD-10-CM

## 2022-05-15 LAB — GLUCOSE, CAPILLARY
Glucose-Capillary: 137 mg/dL — ABNORMAL HIGH (ref 70–99)
Glucose-Capillary: 144 mg/dL — ABNORMAL HIGH (ref 70–99)

## 2022-05-15 LAB — T4, FREE: Free T4: 0.84 ng/dL (ref 0.61–1.12)

## 2022-05-15 MED ORDER — BUDESONIDE 0.5 MG/2ML IN SUSP
0.5000 mg | Freq: Two times a day (BID) | RESPIRATORY_TRACT | Status: DC
  Administered 2022-05-15: 0.5 mg via RESPIRATORY_TRACT
  Filled 2022-05-15: qty 2

## 2022-05-15 MED ORDER — VENTOLIN HFA 108 (90 BASE) MCG/ACT IN AERS: 1.0000 | INHALATION_SPRAY | Freq: Four times a day (QID) | RESPIRATORY_TRACT | 1 refills | Status: AC | PRN

## 2022-05-15 MED ORDER — GUAIFENESIN 100 MG/5ML PO LIQD: 5.0000 mL | ORAL | 0 refills | Status: AC | PRN

## 2022-05-15 MED ORDER — DEXAMETHASONE 2 MG PO TABS: ORAL_TABLET | ORAL | 0 refills | Status: DC

## 2022-05-15 MED ORDER — DICLOFENAC SODIUM 1 % EX GEL: 4.0000 g | Freq: Four times a day (QID) | CUTANEOUS | 0 refills | Status: AC

## 2022-05-15 MED ORDER — GUAIFENESIN ER 600 MG PO TB12: 600.0000 mg | ORAL_TABLET | Freq: Two times a day (BID) | ORAL | 0 refills | Status: AC

## 2022-05-15 MED ORDER — DOXYCYCLINE HYCLATE 100 MG PO TABS: 100.0000 mg | ORAL_TABLET | Freq: Two times a day (BID) | ORAL | 0 refills | Status: AC

## 2022-05-15 MED ORDER — BUDESONIDE 0.5 MG/2ML IN SUSP: 0.5000 mg | Freq: Two times a day (BID) | RESPIRATORY_TRACT | 60 refills | Status: AC

## 2022-05-15 NOTE — Consult Note (Addendum)
   NAMEJohnpatrick Robinson, MRN:  [id number], DOB:  [birth year], LOS: 2 ADMISSION DATE:  05/12/2022, CONSULTATION DATE: 05/13/2022 REFERRING MD: Dr. Algis Liming,  CHIEF COMPLAINT: Asthma exacerbation  History of Present Illness:  Asked to see patient for asthma exacerbation  3-4 exacerbations a month with frequent ED visits Had COVID about 3 to 4 months ago which made his asthma much worse.  Has been on Trelegy for about a year to a year and a half, felt Trelegy did help significantly when he started using it Requires albuterol on a daily basis.  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 rate 17, height 5\' 6"  (1.676 m), weight 78.1 kg, SpO2 98 %.        Intake/Output Summary (Last 24 hours) at 05/15/2022 0921 Last data filed at 05/15/2022 0029 Gross per 24 hour  Intake --  Output 1500 ml  Net -1500 ml   Filed Weights   05/12/22 2329  Weight: 78.1 kg   Physical Exam: General: Well-appearing, no acute distress HENT: [place], AT, OP clear, MMM Eyes: EOMI, no scleral icterus Respiratory: Improved upper airway entry with diminished breath sounds auscultation bilaterally.  No crackles, wheezing or rales Cardiovascular: RRR, -M/R/G, no  JVD Extremities:-Edema,-tenderness Neuro: AAO x4, CNII-XII grossly intact Psych: Normal mood, normal affect  2/10 WBC 19.1 BMET acceptable and unremarkable    Resolved Hospital Problem list     Assessment & Plan:  Severe persistent asthma with exacerbation - improving Peripheral eosinophilia -Nebulizer: Pulmicort BID and Duonebs -Decadron while inpatient  -Doxycycline -Singulair -Ambulatory O2 to determine home O2 needs  Regimen when patient is ready for discharge per primary team: -Pulmicort 0.5 twice a day -Duonebs every 6 hours as needed -Complete antibiotic course -Decadrom taper 6 mg x 3 days, 4 mg x 3 days, 2 mg x 3 days and STOP -Will arrange Pulmonary outpatient for follow-up. May need biologic  Severe anxiety -Setraline 100 mg daily -Atarax started by primary team 2/10  Discussed plan with primary team  Best Practice (right click and "Reselect all SmartList Selections" daily)   Diet/type: Regular consistency (see orders) DVT prophylaxis: LMWH GI prophylaxis: N/A Lines: N/A Foley:  N/A Code Status:  full code Last date of multidisciplinary goals of care discussion [pending]  Care Time: 35 min  Rodman Pickle, M.D. [facility] Pulmonary/Critical Care Medicine 05/15/2022 9:21 AM   See Amion for personal pager For hours between 7 PM to 7 AM, please call Elink for urgent questions

## 2022-05-15 NOTE — Discharge Summary (Signed)
Physician Discharge Summary  Ralph Robinson [id number] DOB: [birth year]  PCP: Bartholome Bill, MD  Admitted from: Home Discharged to: Home  Admit date: 05/12/2022 Discharge date: 05/15/2022  Recommendations for Outpatient Follow-up:    Follow-up Information     Bartholome Bill, MD. Schedule an appointment as soon as possible for a visit in 1 week(s).   Specialty: Family Medicine Why: To be seen with repeat labs (CBC & BMP).  Recommend out patient consultation and follow-up. Contact information: [place] [address] [facility]. Schedule an appointment as soon as possible for a visit.   Specialty: Pulmonology Why: MDs office will call you with an appointment.  Please call their office if you do not hear from them in 3 to 5 days. Contact information: [address] [place] [id number] [phone]                 Home Health: None    Equipment/Devices: None    Discharge Condition: Improved and stable.   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  EMS noted oxygen saturation of 92% on room air, provided DuoNebs, magnesium and Solu-Medrol but continued to worsen, started on CPAP and brought to the ER.  In ER briefly  on BiPAP then transition to [place] oxygen.  Admitted to stepdown unit for acute respiratory distress due to asthma exacerbation.  Had another worsening episode on 2/9 morning.  Pulmonology consulted for assistance and optimization.     Assessment & Plan:   Severe persistent asthma exacerbation with acute respiratory distress: Has seen pulmonology in the past but not recently.  Lifelong non-smoker.  Reports that he has usually about 2-3 episodes of asthma exacerbations per month and more so during the winter season.  He also confirms that even between episodes, he has ongoing wheezing.  Never been intubated.  Found to be acutely dyspneic and in respiratory distress by EMS, briefly on BiPAP in ED and then transitioned to [place] oxygen and admitted to stepdown unit.  Early morning on 2/9, worsening dyspnea, respiratory distress, unable to speak, improved with multiple albuterol nebs.  Reportedly tolerated Decadron and Solu-Medrol but has late reaction to prednisone i.e. severe itching after the course is completed even warranting ED visit.  S/p IV Solu-Medrol, IV magnesium and nebs by EMS.  Here placed on Decadron IV 8 mg every 12 hours which was changed by PCCM to 4 mg twice daily.  Continue scheduled DuoNeb, as needed albuterol nebs, Pulmicort nebs, Singulair.  Added Mucinex and humidified oxygen to loosen up secretions.  Added short course of doxycycline x 5 days due to productive cough/possible acute bronchitis.  Pulmonology consulted.  As needed BiPAP. Influenza  A, B, RSV, SARS coronavirus 2 by RT-PCR: Negative.  Chest x-ray negative. - Pulmonology consultation and follow-up.  Management essentially as noted above.  Indicated that he may need Biologics when he gets stable with workup for same as outpatient.   -2/11: Clinically improved.  Seen by pulmonology and cleared for discharge home.  Ambulated a couple of laps in the ICU hall and did not qualify for home oxygen.  As per pulmonology, at time of discharge,  continue Pulmicort nebulizations twice a day, DuoNeb every 6 hours as needed, complete course of doxycycline, Decadron taper as below until outpatient follow-up with pulmonology which they will arrange.  They advised to hold Trelegy Ellipta. -Confirmed with patient that PTA he used: DuoNebs as needed, albuterol inhaler as needed, Trelegy Ellipta 1 puff daily, and Singulair.  He was not on Atrovent inhaler, had not started Keflex or Valtrex.   Hypokalemia: Replaced.  Magnesium normal.   Leukocytosis: Likely related to steroids and stress response.  Outpatient follow-up of CBC.   Prediabetes: A1c 5.9.  Outpatient follow-up.     Anxiety and depression: Continue home meds/sertraline.  Low TSH: TSH 0.071.  Free T40.84.  Free T3 pending and can be followed up as outpatient.  Clinically euthyroid.  Recommend repeating full TFTs in 4 to 6 weeks.  Outpatient follow-up.   Body mass index is 27.79 kg/m./Overweight    Consultants:   Pulmonology   Procedures:       Discharge Instructions  Discharge Instructions     Call MD for:  difficulty breathing, headache or visual disturbances   Complete by: As directed    Call MD for:  extreme fatigue   Complete by: As directed    Call MD for:  persistant dizziness or light-headedness   Complete by: As directed    Call MD for:  persistant nausea and vomiting   Complete by: As directed    Call MD for:  severe uncontrolled pain   Complete by: As directed    Call MD for:  temperature >100.4   Complete by: As directed    Diet - low sodium heart healthy   Complete by: As directed    Increase activity slowly   Complete by: As directed         Medication List     STOP taking these medications    Atrovent HFA 17 MCG/ACT inhaler Generic drug: ipratropium   cephALEXin 500 MG capsule Commonly known as: KEFLEX   FLUoxetine 20 MG capsule Commonly known as: PROZAC   Naproxen DR 500 MG Tbec   traZODone 50 MG tablet Commonly known as:  DESYREL   Trelegy Ellipta 200-62.5-25 MCG/ACT Aepb Generic drug: Fluticasone-Umeclidin-Vilant   valACYclovir 1000 MG tablet Commonly known as: VALTREX       TAKE these medications    budesonide 0.5 MG/2ML nebulizer solution Commonly known as: PULMICORT Take 2 mLs (0.5 mg total) by nebulization every 12 (twelve) hours.   cetirizine 10 MG tablet Commonly known as: ZYRTEC Take 10 mg by mouth daily.   dexamethasone 2 MG tablet Commonly known as: DECADRON Take 3 tabs (6 mg total) daily x 3 days, then 2 tabs (4 mg total) daily x 3 days, then 1 tab (2 mg total) daily x 3 days, then stop. What changed:  medication strength how much to take how to take this when to take this additional instructions   diclofenac Sodium 1 % Gel Commonly known as: VOLTAREN Apply 4 g topically 4 (four) times daily. Apply to  affected painful area of left side of neck/top of shoulder blade.   doxycycline 100 MG tablet Commonly known as: VIBRA-TABS Take 1 tablet (100 mg total) by mouth every 12 (twelve) hours for 5 doses.   guaiFENesin 600 MG 12 hr tablet Commonly known as: MUCINEX Take 1 tablet (600 mg total) by mouth 2 (two) times daily for 7 days.   guaiFENesin 100 MG/5ML liquid Commonly known as: ROBITUSSIN Take 5 mLs by mouth every 4 (four) hours as needed for cough or to loosen phlegm.   ibuprofen 200 MG tablet Commonly known as: ADVIL Take 600 mg by mouth every 8 (eight) hours as needed for mild pain.   ipratropium-albuterol 0.5-2.5 (3) MG/3ML Soln Commonly known as: DUONEB Take 3 mLs by nebulization every 6 (six) hours as needed (wheezing).   montelukast 10 MG tablet Commonly known as: SINGULAIR Take 10 mg by mouth at bedtime.   omeprazole 20 MG capsule Commonly known as: PRILOSEC Take 20 mg by mouth daily as needed Jerrye Bushy).   sertraline 100 MG tablet Commonly known as: ZOLOFT Take 100 mg by mouth at bedtime.   Ventolin HFA 108 (90 Base) MCG/ACT inhaler Generic drug:  albuterol Inhale 1-2 puffs into the lungs every 6 (six) hours as needed for wheezing or shortness of breath. What changed: Another medication with the same name was removed. Continue taking this medication, and follow the directions you see here.   Vitamin D (Ergocalciferol) 1.25 MG (50000 UNIT) Caps capsule Commonly known as: DRISDOL Take 50,000 Units by mouth once a week. Tuesday       Allergies  Allergen Reactions   Beef-Derived Products    Prednisone Rash    Other reaction(s): Other (See Comments) Shooting foot pain, increased appetite, feels drained.      Procedures/Studies: DG Chest Portable 1 View  Result Date: 05/12/2022 CLINICAL DATA:  Shortness of breath EXAM: PORTABLE CHEST 1 VIEW COMPARISON:  04/28/2022 FINDINGS: The heart size and mediastinal contours are within normal limits. Both lungs are clear. The visualized skeletal structures are unremarkable. IMPRESSION: No active disease. Electronically Signed   By: Donavan Foil M.D.   On: 05/12/2022 20:25   DG Chest Portable 1 View  Result Date: 04/28/2022 CLINICAL DATA:  Shortness of breath EXAM: PORTABLE CHEST 1 VIEW COMPARISON:  Chest x-ray 02/27/2022 FINDINGS: The heart size and mediastinal contours are within normal limits. Both lungs are clear. The visualized skeletal structures are unremarkable. IMPRESSION: No active disease. Electronically Signed   By: Ronney Asters M.D.   On: 04/28/2022 22:40      Subjective: Feels much better today.  Dyspnea improved and able to ambulate the halls.  Still has phlegm that he says has difficulty bringing up.  No chest pain.  Tolerating diet that his spouse brought for him.  Also reports left-sided neck/supraclavicular pain that started a couple days prior to admission, no trauma or lifting anything heavy.  No weakness, tingling or numbness in that extremity.  Discharge Exam:  Vitals:   05/15/22 0838 05/15/22 0900 05/15/22 1000 05/15/22 1128  BP:  127/83 (!) 109/50   Pulse:  95 97    Resp:  18 (!) 21   Temp:    98.2 F (36.8 C)  TempSrc:    Oral  SpO2: 98% 93% 95%   Weight:      Height:        General exam: Young male, moderately built and nourished sitting up comfort Tublu in bed without distress.  Looks improved compared to yesterday.  Respiratory system: Clear to auscultation with occasional rhonchi posteriorly but overall much improved compared to the last 48 hours.  No crackles.  No increased work of breathing.  Able to speak in full sentences. Cardiovascular system: S1 & S2 heard, RRR. No JVD, murmurs, rubs, gallops or clicks. No pedal edema.  Telemetry personally reviewed: Sinus rhythm. Gastrointestinal system: Abdomen is nondistended, soft and nontender. No organomegaly or masses felt. Normal bowel sounds heard. Central nervous system: Alert and oriented. No focal neurological deficits. Extremities: Symmetric 5 x 5 power. Skin: No rashes, lesions or ulcers Psychiatry: Judgement and insight appear normal. Mood & affect appropriate.     The results of significant diagnostics from this hospitalization (including imaging, microbiology, ancillary and laboratory) are listed below for reference.     Microbiology: Recent Results (from the past 240 hour(s))  Resp panel by RT-PCR (RSV, Flu A&B, Covid) Anterior Nasal Swab     Status: None   Collection Time: 05/12/22  8:05 PM   Specimen: Anterior Nasal Swab  Result Value Ref Range Status   SARS Coronavirus 2 by RT PCR NEGATIVE NEGATIVE Final    Comment: (NOTE) SARS-CoV-2 target nucleic acids are NOT DETECTED.  The SARS-CoV-2 RNA is generally detectable in upper respiratory specimens during the acute phase of infection. The lowest concentration of SARS-CoV-2 viral copies this assay can detect is 138 copies/mL. A negative result does not preclude SARS-Cov-2 infection and should not be used as the sole basis for treatment or other patient management decisions. A negative result may occur with  improper specimen  collection/handling, submission of specimen other than nasopharyngeal swab, presence of viral mutation(s) within the areas targeted by this assay, and inadequate number of viral copies(<138 copies/mL). A negative result must be combined with clinical observations, patient history, and epidemiological information. The expected result is Negative.  Fact Sheet for Patients:  EntrepreneurPulse.com.au  Fact Sheet for Healthcare Providers:  IncredibleEmployment.be  This test is no t yet approved or cleared by the [place] FDA and  has been authorized for detection and/or diagnosis of SARS-CoV-2 by FDA under an Emergency Use Authorization (EUA). This EUA will remain  in effect (meaning this test can be used) for the duration of the COVID-19 declaration under Section 564(b)(1) of the Act, 21 U.S.C.section 360bbb-3(b)(1), unless the authorization is terminated  or revoked sooner.       Influenza A by PCR NEGATIVE NEGATIVE Final   Influenza B by PCR NEGATIVE NEGATIVE Final    Comment: (NOTE) The Xpert Xpress SARS-CoV-2/FLU/RSV plus assay is intended as an aid in the diagnosis of influenza from Nasopharyngeal swab specimens and should not be used as a sole basis for treatment. Nasal washings and aspirates are unacceptable for Xpert Xpress SARS-CoV-2/FLU/RSV testing.  Fact Sheet for Patients: EntrepreneurPulse.com.au  Fact Sheet for Healthcare Providers: IncredibleEmployment.be  This test is not yet approved or cleared by the [place] FDA and has been authorized for detection and/or diagnosis of SARS-CoV-2 by FDA under an Emergency Use Authorization (EUA). This EUA will remain in effect (meaning this test can be used) for the duration of the COVID-19 declaration under Section 564(b)(1) of the Act, 21 U.S.C. section 360bbb-3(b)(1), unless the authorization is terminated or revoked.     Resp Syncytial  Virus by PCR NEGATIVE NEGATIVE Final    Comment: (NOTE) Fact Sheet for Patients: EntrepreneurPulse.com.au  Fact Sheet for Healthcare Providers: IncredibleEmployment.be  This test is not yet approved or cleared by the [place] FDA and has been authorized for detection  and/or diagnosis of SARS-CoV-2 by FDA under an Emergency Use Authorization (EUA). This EUA will remain in effect (meaning this test can be used) for the duration of the COVID-19 declaration under Section 564(b)(1) of the Act, 21 U.S.C. section 360bbb-3(b)(1), unless the authorization is terminated or revoked.  Performed at [facility], [place] [address] [place], [place] [address]   MRSA Next Gen by PCR, Nasal     Status: None   Collection Time: 05/12/22 10:25 PM   Specimen: Nasal Mucosa; Nasal Swab  Result Value Ref Range Status   MRSA by PCR Next Gen NOT DETECTED NOT DETECTED Final    Comment: (NOTE) The GeneXpert MRSA Assay (FDA approved for NASAL specimens only), is one component of a comprehensive MRSA colonization surveillance program. It is not intended to diagnose MRSA infection nor to guide or monitor treatment for MRSA infections. Test performance is not FDA approved in patients less than 77 years old. Performed at [facility], [place] [address] [place], [place] [address]      Labs: CBC: Recent Labs  Lab 05/12/22 2005 05/13/22 0025 05/14/22 0312  WBC 10.5 15.6* 19.1*  NEUTROABS 5.1  --   --   HGB 14.8 14.2 12.9*  HCT 46.1 45.1 39.7  MCV 83.7 84.9 83.4  PLT 168 174 [id number]    Basic Metabolic Panel: Recent Labs  Lab 05/12/22 2005 05/13/22 0025 05/14/22 0312  NA 137 135 136  K 3.6 3.3* 3.9  CL 103 104 103  CO2 23 18* 23  GLUCOSE 114* 182* 133*  BUN 11 11 16  $ CREATININE 0.88 1.02 0.98  CALCIUM 8.9 8.9 9.2  MG  --   --  2.4    Liver Function Tests: Recent Labs  Lab 05/12/22 2005  AST 25  ALT 29  ALKPHOS 46   BILITOT 0.6  PROT 7.4  ALBUMIN 4.1    CBG: Recent Labs  Lab 05/14/22 1136 05/14/22 1641 05/14/22 2152 05/15/22 0807 05/15/22 1127  GLUCAP 201* 153* 191* 137* 144*    Hgb A1c Recent Labs    05/13/22 0025  HGBA1C 5.9*    Thyroid function studies Recent Labs    05/14/22 0803  TSH 0.071*     Time coordinating discharge: 35 minutes  SIGNED:  Vernell Leep, MD,  FACP, FHM, [facility], [facility], [facility]   [facility] [place]     To contact the attending provider between 7A-7P or the covering provider during after hours 7P-7A, please log into the web site [email or web address] and access using universal [place] password for that web site. If you do not have the password, please call the hospital operator.

## 2022-05-15 NOTE — Progress Notes (Signed)
Pt walked unit twice. Oxygen stayed above 88% on RA

## 2022-05-15 NOTE — Discharge Instructions (Signed)

## 2022-05-15 NOTE — Plan of Care (Signed)

## 2022-05-15 NOTE — Telephone Encounter (Signed)
Please arrange follow-up with Dr. Gala Murdoch or Dr. Loanne Drilling for hospital follow-up for asthma

## 2022-05-17 LAB — T3, FREE: T3, Free: 3.3 pg/mL (ref 2.0–4.4)

## 2022-05-19 NOTE — Telephone Encounter (Signed)
Left voicemail for patient to call back to schedule HFU with AO or JE.

## 2022-05-20 NOTE — Telephone Encounter (Signed)
Patient is scheduled on 2/27 at 1015am with Dr. Erin Fulling

## 2022-05-31 ENCOUNTER — Encounter: Payer: Self-pay | Admitting: Pulmonary Disease

## 2022-05-31 ENCOUNTER — Ambulatory Visit: Payer: Medicaid Other | Admitting: Pulmonary Disease

## 2022-05-31 VITALS — BP 122/84 | HR 86 | Ht 66.0 in | Wt 180.0 lb

## 2022-05-31 DIAGNOSIS — J4550 Severe persistent asthma, uncomplicated: Secondary | ICD-10-CM

## 2022-05-31 DIAGNOSIS — R053 Chronic cough: Secondary | ICD-10-CM

## 2022-05-31 NOTE — Progress Notes (Unsigned)
Synopsis: Referred in February 2024 for asthma by Bartholome Bill, MD  Subjective:   PATIENT ID: Ralph Robinson GENDER: male DOB: 10/31/1975, MRN: [id number]   HPI  Chief Complaint  Patient presents with   Consult    Referred for history of asthma. Was recently in the hospital for an asthma flare up.    Ralph Robinson is a 47 year old male, never smoker with history of asthma who is referred to pulmonary clinic for evaluation of asthma.   He is currently using trelegy ellipta 200, 1 puff twice daily and as needed albuterol inhaler and nebulizer treatments. He has wheezing daily. He also uses steroids as needed if his symptoms are worse. He is coughing up thick mucous.  Multiple ER visits (6 in total) over the past year for asthma exacerbations.  He has history of nasal polyps s/p polypectomy and total ethmoidectomy and maxillary antrostomy on 09/22/20. On follow up 03/08/2021 he had recurrence of polyps and was planned to start dupixent but was not covered by insurance. He was never started on dupixent. He reports his sinus disease is overall better since his surgery.  Absolute eosinophil count ranges 400 to 800 between 02/1622 to 05/12/22  He is a never smoker. 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 He does where a mask at work.  Never smoker. Has been using chewing tobacco.  Allergies all year round Thick mucous, chest congestion Last took decadron yesterday  FH: no lung disease, daughter has bad allergies Pets: none Work: Therapist, music work, Buyer, retail, uses masks at work.   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   Social History   Socioeconomic History   Marital status: Married    Spouse name: Not on file   Number of children: Not on file   Years of education: Not on file   Highest education level: Not on file   Occupational History   Not on file  Tobacco Use   Smoking status: Never   Smokeless tobacco: Never  Substance and Sexual Activity   Alcohol use: No   Drug use: No   Sexual activity: Not on file  Other Topics Concern   Not on file  Social History Narrative   Not on file   Social Determinants of Health   Financial Resource Strain: Not on file  Food Insecurity: No Food Insecurity (05/13/2022)   Hunger Vital Sign    Worried About Running Out of Food in the Last Year: Never true    Ran Out of Food in the Last Year: Never true  Transportation Needs: No Transportation Needs (05/13/2022)   PRAPARE - Hydrologist (Medical): No    Lack of Transportation (Non-Medical): No  Physical Activity: Not on file  Stress: Not on file  Social Connections: Not on file  Intimate Partner Violence: Not At Risk (05/13/2022)   Humiliation, Afraid, Rape, and Kick questionnaire    Fear of Current or Ex-Partner: No    Emotionally Abused: No    Physically Abused: No    Sexually Abused: No     Allergies  Allergen Reactions   Beef-Derived Products    Prednisone Rash    Other reaction(s): Other (See Comments) Shooting foot pain, increased appetite, feels drained.  Outpatient Medications Prior to Visit  Medication Sig Dispense Refill   albuterol (VENTOLIN HFA) 108 (90 Base) MCG/ACT inhaler Inhale 1-2 puffs into the lungs every 6 (six) hours as needed for wheezing or shortness of breath. 54 g 1   budesonide (PULMICORT) 0.5 MG/2ML nebulizer solution Take 2 mLs (0.5 mg total) by nebulization every 12 (twelve) hours. 2 mL 60   cetirizine (ZYRTEC) 10 MG tablet Take 10 mg by mouth daily.     dexamethasone (DECADRON) 2 MG tablet Take 3 tabs (6 mg total) daily x 3 days, then 2 tabs (4 mg total) daily x 3 days, then 1 tab (2 mg total) daily x 3 days, then stop. 18 tablet 0   diclofenac Sodium (VOLTAREN) 1 % GEL Apply 4 g topically 4 (four) times daily. Apply to affected painful area  of left side of neck/top of shoulder blade. 100 g 0   Fluticasone-Umeclidin-Vilant (TRELEGY ELLIPTA) 200-62.5-25 MCG/ACT AEPB Inhale into the lungs.     guaiFENesin (ROBITUSSIN) 100 MG/5ML liquid Take 5 mLs by mouth every 4 (four) hours as needed for cough or to loosen phlegm. 120 mL 0   ibuprofen (ADVIL) 200 MG tablet Take 600 mg by mouth every 8 (eight) hours as needed for mild pain.     ipratropium-albuterol (DUONEB) 0.5-2.5 (3) MG/3ML SOLN Take 3 mLs by nebulization every 6 (six) hours as needed (wheezing).     montelukast (SINGULAIR) 10 MG tablet Take 10 mg by mouth at bedtime.     omeprazole (PRILOSEC) 20 MG capsule Take 20 mg by mouth daily as needed Jerrye Bushy).     sertraline (ZOLOFT) 100 MG tablet Take 100 mg by mouth at bedtime.     Vitamin D, Ergocalciferol, (DRISDOL) 1.25 MG (50000 UNIT) CAPS capsule Take 50,000 Units by mouth once a week. Tuesday     No facility-administered medications prior to visit.    ROS    Objective:   Vitals:   05/31/22 1013  BP: 122/84  Pulse: 86  SpO2: 95%  Weight: 180 lb (81.6 kg)  Height: '5\' 6"'$  (1.676 m)     Physical Exam    CBC    Component Value Date/Time   WBC 19.1 (H) 05/14/2022 0312   RBC 4.76 05/14/2022 0312   HGB 12.9 (L) 05/14/2022 0312   HCT 39.7 05/14/2022 0312   PLT 171 05/14/2022 0312   MCV 83.4 05/14/2022 0312   MCH 27.1 05/14/2022 0312   MCHC 32.5 05/14/2022 0312   RDW 14.9 05/14/2022 0312   LYMPHSABS 4.3 (H) 05/12/2022 2005   MONOABS 0.5 05/12/2022 2005   EOSABS 0.6 (H) 05/12/2022 2005   BASOSABS 0.0 05/12/2022 2005      Latest Ref Rng & Units 05/14/2022    3:12 AM 05/13/2022   12:25 AM 05/12/2022    8:05 PM  BMP  Glucose 70 - 99 mg/dL 133  182  114   BUN 6 - 20 mg/dL '16  11  11   '$ Creatinine 0.61 - 1.24 mg/dL 0.98  1.02  0.88   Sodium 135 - 145 mmol/L 136  135  137   Potassium 3.5 - 5.1 mmol/L 3.9  3.3  3.6   Chloride 98 - 111 mmol/L 103  104  103   CO2 22 - 32 mmol/L '23  18  23   '$ Calcium 8.9 - 10.3 mg/dL  9.2  8.9  8.9    Chest imaging: CXR 05/12/22 The heart size and mediastinal contours are within normal limits. Both lungs are  clear. The visualized skeletal structures are unremarkable.  PFT:     No data to display          Labs:  Path:  Echo:  Heart Catheterization:       Assessment & Plan:   Severe persistent asthma without complication - Plan: IgE, Pulmonary Function Test, ANCA screen with reflex titer, Aspergillus IgE Panel, Aspergillus fumigatus IgG, Aspergillus fumigatus IgG, Aspergillus IgE Panel, ANCA screen with reflex titer, IgE  Chronic cough - Plan: CT Chest Wo Contrast, Respiratory or Resp and Sputum Culture, Fungus Culture & Smear, AFB Culture & Smear  Discussion: ***    Current Outpatient Medications:    albuterol (VENTOLIN HFA) 108 (90 Base) MCG/ACT inhaler, Inhale 1-2 puffs into the lungs every 6 (six) hours as needed for wheezing or shortness of breath., Disp: 54 g, Rfl: 1   budesonide (PULMICORT) 0.5 MG/2ML nebulizer solution, Take 2 mLs (0.5 mg total) by nebulization every 12 (twelve) hours., Disp: 2 mL, Rfl: 60   cetirizine (ZYRTEC) 10 MG tablet, Take 10 mg by mouth daily., Disp: , Rfl:    dexamethasone (DECADRON) 2 MG tablet, Take 3 tabs (6 mg total) daily x 3 days, then 2 tabs (4 mg total) daily x 3 days, then 1 tab (2 mg total) daily x 3 days, then stop., Disp: 18 tablet, Rfl: 0   diclofenac Sodium (VOLTAREN) 1 % GEL, Apply 4 g topically 4 (four) times daily. Apply to affected painful area of left side of neck/top of shoulder blade., Disp: 100 g, Rfl: 0   Fluticasone-Umeclidin-Vilant (TRELEGY ELLIPTA) 200-62.5-25 MCG/ACT AEPB, Inhale into the lungs., Disp: , Rfl:    guaiFENesin (ROBITUSSIN) 100 MG/5ML liquid, Take 5 mLs by mouth every 4 (four) hours as needed for cough or to loosen phlegm., Disp: 120 mL, Rfl: 0   ibuprofen (ADVIL) 200 MG tablet, Take 600 mg by mouth every 8 (eight) hours as needed for mild pain., Disp: , Rfl:     ipratropium-albuterol (DUONEB) 0.5-2.5 (3) MG/3ML SOLN, Take 3 mLs by nebulization every 6 (six) hours as needed (wheezing)., Disp: , Rfl:    montelukast (SINGULAIR) 10 MG tablet, Take 10 mg by mouth at bedtime., Disp: , Rfl:    omeprazole (PRILOSEC) 20 MG capsule, Take 20 mg by mouth daily as needed Jerrye Bushy)., Disp: , Rfl:    sertraline (ZOLOFT) 100 MG tablet, Take 100 mg by mouth at bedtime., Disp: , Rfl:    Vitamin D, Ergocalciferol, (DRISDOL) 1.25 MG (50000 UNIT) CAPS capsule, Take 50,000 Units by mouth once a week. Tuesday, Disp: , Rfl:

## 2022-05-31 NOTE — Patient Instructions (Addendum)
We will check labs today in order to qualify you for Dupixent therapy.   We will check sputum cultures  We will check a CT Chest scan for your on going symptoms  We will schedule you for pulmonary function tests within the next 1-2 weeks.  Continue trelegy ellipta 1 puff daily - rinse mouth out after each use  Continue Albuterol inhaler or nebulizer treatments as needed  Continue zyrtec and Singulair daily  We will schedule you with our pharmacy team to start dupixent therapy  Follow up in 2 months

## 2022-06-01 ENCOUNTER — Other Ambulatory Visit: Payer: Medicaid Other | Admitting: Pharmacist

## 2022-06-01 ENCOUNTER — Encounter: Payer: Self-pay | Admitting: Pulmonary Disease

## 2022-06-01 ENCOUNTER — Telehealth: Payer: Self-pay | Admitting: Pharmacist

## 2022-06-01 NOTE — Telephone Encounter (Signed)
Patient was supposed to have appt today to discus Dupixnet per request of Dr. Erin Fulling. PT was no-show. Will start [place] and can review medication in detail over the phone or otherwise at new start visit  Abs eos 600 on 05/12/2022  Knox Saliva, PharmD, MPH, BCPS, CPP Clinical Pharmacist (Rheumatology and Pulmonology)

## 2022-06-02 LAB — ASPERGILLUS FUMIGATUS IGG: Aspergillus fumigatus IgG: 43.3 mg/L (ref 0.0–55.9)

## 2022-06-03 NOTE — Telephone Encounter (Signed)
Submitted a Prior Authorization request to  [facility]  for [place] via CoverMyMeds. Will update once we receive a response.  Key: [id number]  Knox Saliva, PharmD, MPH, BCPS, CPP Clinical Pharmacist (Rheumatology and Pulmonology)

## 2022-06-04 LAB — ASPERGILLUS IGE PANEL
A. Amstel/Glaucu Class Interp: 0
A. Flavus Class Interp: 0
A. Fumigatus Class Interp: 0
A. Nidulans Class Interp: 0
A. Niger Class Interp: 0
A. Versicolor Class Interp: 0
Aspergillus amstel/glaucu IgE*: <0.35 kU/L (ref <0.35)
Aspergillus flavus IgE: <0.35 kU/L (ref <0.35)
Aspergillus fumigatus IgE: <0.1 kU/L (ref <0.35)
Aspergillus nidulans IgE: <0.35 kU/L (ref <0.35)
Aspergillus niger IgE: <0.1 kU/L (ref <0.35)
Aspergillus versicolor IgE: <0.1 kU/L (ref <0.35)

## 2022-06-05 ENCOUNTER — Ambulatory Visit (HOSPITAL_BASED_OUTPATIENT_CLINIC_OR_DEPARTMENT_OTHER): Payer: Medicaid Other

## 2022-06-05 LAB — ANCA SCREEN W REFLEX TITER: ANCA SCREEN: NEGATIVE

## 2022-06-05 LAB — IGE: IgE (Immunoglobulin E), Serum: 620 kU/L — ABNORMAL HIGH (ref <=114)

## 2022-06-06 ENCOUNTER — Ambulatory Visit (HOSPITAL_BASED_OUTPATIENT_CLINIC_OR_DEPARTMENT_OTHER)
Admission: RE | Admit: 2022-06-06 | Discharge: 2022-06-06 | Disposition: A | Discharge status: Home / Self Care | Payer: Medicaid Other | Source: Ambulatory Visit | Attending: Pulmonary Disease | Admitting: Pulmonary Disease

## 2022-06-06 DIAGNOSIS — R053 Chronic cough: Secondary | ICD-10-CM | POA: Insufficient documentation

## 2022-06-10 NOTE — Telephone Encounter (Addendum)
Received notification from  [facility]  regarding a prior authorization for DUPIXENT. Authorization has been APPROVED from 06/03/22 to 11/30/22.   Per test claim, copay for 28 days supply is $4  Patient can fill through [facility]: [phone]   Authorization # [id number]  ATC patient to schedule Dupixent new start. Unable to reach. Left MV requesting return call  Chesley Mires, PharmD, MPH, BCPS, CPP Clinical Pharmacist (Rheumatology and Pulmonology)

## 2022-08-04 ENCOUNTER — Ambulatory Visit: Payer: Medicaid Other | Admitting: Physician Assistant

## 2022-08-04 DIAGNOSIS — R079 Chest pain, unspecified: Secondary | ICD-10-CM

## 2022-08-04 NOTE — Progress Notes (Signed)
Patient presented to the mobile unit, c/o chest pain and pain radiating down right arm. He was scheduled at 2pm. He did not return to the mobile unit for care. A call was placed to follow up, unable to reach. VM left encouraging him to return to mobile or be seen at a local urgent care facility.

## 2022-08-05 ENCOUNTER — Emergency Department (HOSPITAL_COMMUNITY): Payer: Medicaid Other

## 2022-08-05 ENCOUNTER — Other Ambulatory Visit: Payer: Self-pay

## 2022-08-05 ENCOUNTER — Emergency Department (HOSPITAL_COMMUNITY)
Admission: EM | Admit: 2022-08-05 | Discharge: 2022-08-05 | Disposition: A | Discharge status: Home / Self Care | Payer: Medicaid Other | Attending: Emergency Medicine | Admitting: Emergency Medicine

## 2022-08-05 ENCOUNTER — Encounter (HOSPITAL_COMMUNITY): Payer: Self-pay

## 2022-08-05 DIAGNOSIS — J45901 Unspecified asthma with (acute) exacerbation: Secondary | ICD-10-CM | POA: Diagnosis not present

## 2022-08-05 DIAGNOSIS — J069 Acute upper respiratory infection, unspecified: Secondary | ICD-10-CM | POA: Insufficient documentation

## 2022-08-05 DIAGNOSIS — Z7951 Long term (current) use of inhaled steroids: Secondary | ICD-10-CM | POA: Insufficient documentation

## 2022-08-05 DIAGNOSIS — Z20822 Contact with and (suspected) exposure to covid-19: Secondary | ICD-10-CM | POA: Insufficient documentation

## 2022-08-05 DIAGNOSIS — R0602 Shortness of breath: Secondary | ICD-10-CM | POA: Diagnosis present

## 2022-08-05 LAB — RESP PANEL BY RT-PCR (RSV, FLU A&B, COVID)  RVPGX2
Influenza A by PCR: NEGATIVE
Influenza B by PCR: NEGATIVE
Resp Syncytial Virus by PCR: NEGATIVE
SARS Coronavirus 2 by RT PCR: NEGATIVE

## 2022-08-05 LAB — CBC WITH DIFFERENTIAL/PLATELET
Abs Immature Granulocytes: 0.04 10*3/uL (ref 0.00–0.07)
Basophils Absolute: 0.1 10*3/uL (ref 0.0–0.1)
Basophils Relative: 1 %
Eosinophils Absolute: 0.6 10*3/uL — ABNORMAL HIGH (ref 0.0–0.5)
Eosinophils Relative: 7 %
HCT: 42.3 % (ref 39.0–52.0)
Hemoglobin: 13.6 g/dL (ref 13.0–17.0)
Immature Granulocytes: 0 %
Lymphocytes Relative: 9 %
Lymphs Abs: 0.8 10*3/uL (ref 0.7–4.0)
MCH: 26.9 pg (ref 26.0–34.0)
MCHC: 32.2 g/dL (ref 30.0–36.0)
MCV: 83.6 fL (ref 80.0–100.0)
Monocytes Absolute: 0.9 10*3/uL (ref 0.1–1.0)
Monocytes Relative: 10 %
Neutro Abs: 6.8 10*3/uL (ref 1.7–7.7)
Neutrophils Relative %: 73 %
Platelets: 157 10*3/uL (ref 150–400)
RBC: 5.06 MIL/uL (ref 4.22–5.81)
RDW: 14.4 % (ref 11.5–15.5)
WBC: 9.2 10*3/uL (ref 4.0–10.5)
nRBC: 0 % (ref 0.0–0.2)

## 2022-08-05 LAB — BASIC METABOLIC PANEL
Anion gap: 10 (ref 5–15)
BUN: 9 mg/dL (ref 6–20)
CO2: 22 mmol/L (ref 22–32)
Calcium: 9.1 mg/dL (ref 8.9–10.3)
Chloride: 104 mmol/L (ref 98–111)
Creatinine, Ser: 0.99 mg/dL (ref 0.61–1.24)
GFR, Estimated: >60 mL/min (ref >60)
Glucose, Bld: 109 mg/dL — ABNORMAL HIGH (ref 70–99)
Potassium: 3.7 mmol/L (ref 3.5–5.1)
Sodium: 136 mmol/L (ref 135–145)

## 2022-08-05 LAB — SARS CORONAVIRUS 2 BY RT PCR: SARS Coronavirus 2 by RT PCR: NEGATIVE

## 2022-08-05 MED ORDER — DEXAMETHASONE 2 MG PO TABS: ORAL_TABLET | ORAL | 0 refills | Status: AC

## 2022-08-05 MED ORDER — SERTRALINE HCL 50 MG PO TABS: 100.0000 mg | ORAL_TABLET | Freq: Every day | ORAL | 0 refills | Status: AC

## 2022-08-05 MED ORDER — IPRATROPIUM BROMIDE 0.02 % IN SOLN: 0.5000 mg | RESPIRATORY_TRACT | 12 refills | Status: AC | PRN

## 2022-08-05 MED ORDER — METHYLPREDNISOLONE SODIUM SUCC 125 MG IJ SOLR
125.0000 mg | Freq: Once | INTRAMUSCULAR | Status: AC
  Administered 2022-08-05: 125 mg via INTRAVENOUS
  Filled 2022-08-05: qty 2

## 2022-08-05 MED ORDER — ALBUTEROL SULFATE (2.5 MG/3ML) 0.083% IN NEBU
10.0000 mg/h | INHALATION_SOLUTION | RESPIRATORY_TRACT | Status: AC
  Administered 2022-08-05: 10 mg/h via RESPIRATORY_TRACT
  Filled 2022-08-05: qty 12

## 2022-08-05 MED ORDER — IPRATROPIUM-ALBUTEROL 0.5-2.5 (3) MG/3ML IN SOLN
3.0000 mL | Freq: Once | RESPIRATORY_TRACT | Status: AC
  Administered 2022-08-05: 3 mL via RESPIRATORY_TRACT
  Filled 2022-08-05: qty 3

## 2022-08-05 MED ORDER — MAGNESIUM SULFATE 2 GM/50ML IV SOLN
2.0000 g | Freq: Once | INTRAVENOUS | Status: AC
  Administered 2022-08-05: 2 g via INTRAVENOUS
  Filled 2022-08-05: qty 50

## 2022-08-05 NOTE — ED Notes (Signed)
Pt is resting with equal chest rise and fall, no obvious distress.

## 2022-08-05 NOTE — ED Provider Notes (Signed)
[place] EMERGENCY DEPARTMENT AT [facility] Provider Note   CSN: [id number] Arrival date & time: 08/05/22  0800     History {Add pertinent medical, surgical, social history, OB history to HPI:1} Chief Complaint  Patient presents with   Shortness of Breath    Ralph Robinson is a 47 y.o. male with a history of asthma presented to ED with shortness of breath and fever.  Patient reports shortness of breath that began around 3 AM this morning.  He tried a breathing treatment at home.  He then ran a fever at home and took ibuprofen prior to coming into the hospital.  He reports a headache and a cough.  HPI     Home Medications Prior to Admission medications   Medication Sig Start Date End Date Taking? Authorizing Provider  albuterol (VENTOLIN HFA) 108 (90 Base) MCG/ACT inhaler Inhale 1-2 puffs into the lungs every 6 (six) hours as needed for wheezing or shortness of breath. 05/15/22   Hongalgi, Maximino Greenland, MD  budesonide (PULMICORT) 0.5 MG/2ML nebulizer solution Take 2 mLs (0.5 mg total) by nebulization every 12 (twelve) hours. 05/15/22   Hongalgi, Maximino Greenland, MD  cetirizine (ZYRTEC) 10 MG tablet Take 10 mg by mouth daily.    [provider]  dexamethasone (DECADRON) 2 MG tablet Take 3 tabs (6 mg total) daily x 3 days, then 2 tabs (4 mg total) daily x 3 days, then 1 tab (2 mg total) daily x 3 days, then stop. 05/15/22   Hongalgi, Maximino Greenland, MD  diclofenac Sodium (VOLTAREN) 1 % GEL Apply 4 g topically 4 (four) times daily. Apply to affected painful area of left side of neck/top of shoulder blade. 05/15/22   Elease Etienne, MD  Fluticasone-Umeclidin-Vilant (TRELEGY ELLIPTA) 200-62.5-25 MCG/ACT AEPB Inhale into the lungs.    [provider]  guaiFENesin (ROBITUSSIN) 100 MG/5ML liquid Take 5 mLs by mouth every 4 (four) hours as needed for cough or to loosen phlegm. 05/15/22   Hongalgi, Maximino Greenland, MD  ibuprofen (ADVIL) 200 MG tablet Take 600 mg by mouth every 8 (eight) hours as  needed for mild pain.    [provider]  ipratropium-albuterol (DUONEB) 0.5-2.5 (3) MG/3ML SOLN Take 3 mLs by nebulization every 6 (six) hours as needed (wheezing). 05/05/22   [provider]  montelukast (SINGULAIR) 10 MG tablet Take 10 mg by mouth at bedtime.    [provider]  omeprazole (PRILOSEC) 20 MG capsule Take 20 mg by mouth daily as needed Genella Rife). 04/01/22   [provider]  sertraline (ZOLOFT) 100 MG tablet Take 100 mg by mouth at bedtime. 04/03/22   [provider]  Vitamin D, Ergocalciferol, (DRISDOL) 1.25 MG (50000 UNIT) CAPS capsule Take 50,000 Units by mouth once a week. Tuesday 04/01/22   [provider]      Allergies    Beef-derived products and Prednisone    Review of Systems   Review of Systems  Physical Exam Updated Vital Signs BP 125/78 (BP Location: Right Arm)   Pulse 89   Temp 98.6 F (37 C) (Oral)   Resp (!) 27   Wt 81 kg   SpO2 91%   BMI 28.82 kg/m  Physical Exam Constitutional:      General: He is not in acute distress. HENT:     Head: Normocephalic and atraumatic.  Eyes:     Conjunctiva/sclera: Conjunctivae normal.     Pupils: Pupils are equal, round, and reactive to light.  Cardiovascular:  Rate and Rhythm: Normal rate and regular rhythm.  Pulmonary:     Effort: Pulmonary effort is normal. No respiratory distress.     Comments: Speaking in short sentences, expiratory wheezing bilaterally Abdominal:     General: There is no distension.     Tenderness: There is no abdominal tenderness.  Skin:    General: Skin is warm and dry.  Neurological:     General: No focal deficit present.     Mental Status: He is alert. Mental status is at baseline.  Psychiatric:        Mood and Affect: Mood normal.        Behavior: Behavior normal.     ED Results / Procedures / Treatments   Labs (all labs ordered are listed, but only abnormal results are displayed) Labs Reviewed  SARS CORONAVIRUS 2 BY  RT PCR  RESP PANEL BY RT-PCR (RSV, FLU A&B, COVID)  [id number]  BASIC METABOLIC PANEL  CBC WITH DIFFERENTIAL/PLATELET    EKG EKG Interpretation  Date/Time:  Friday Aug 05 2022 08:12:56 EDT Ventricular Rate:  97 PR Interval:  133 QRS Duration: 89 QT Interval:  331 QTC Calculation: 421 R Axis:   87 Text Interpretation: Sinus rhythm Abnormal R-wave progression, early transition  No sig changes from prior tracing May 12 2022 Confirmed by Alvester Chou [phone]) on 08/05/2022 8:23:33 AM  Radiology No results found.  Procedures Procedures  {Document cardiac monitor, telemetry assessment procedure when appropriate:1}  Medications Ordered in ED Medications  albuterol (PROVENTIL,VENTOLIN) solution continuous neb (has no administration in time range)  magnesium sulfate IVPB 2 g 50 mL (has no administration in time range)  methylPREDNISolone sodium succinate (SOLU-MEDROL) 125 mg/2 mL injection 125 mg (has no administration in time range)    ED Course/ Medical Decision Making/ A&P   {   Click here for ABCD2, HEART and other calculatorsREFRESH Note before signing :1}                          Medical Decision Making Amount and/or Complexity of Data Reviewed Labs: ordered. Radiology: ordered.  Risk Prescription drug management.   This patient presents to the ED with concern for shortness of breath, fever, cough, headache. This involves an extensive number of treatment options, and is a complaint that carries with it a high risk of complications and morbidity.  The differential diagnosis includes viral URI versus bacterial infection with likely asthma exacerbation component  Co-morbidities that complicate the patient evaluation: History of asthma, and high risk complication and recurrence  External records from outside source obtained and reviewed including hx of asthma, exacerbations treated in ED in Nov 2023, Jan and Feb 2024  I ordered and personally interpreted labs.  The pertinent  results include:  ***  I ordered imaging studies including dg chest I independently visualized and interpreted imaging which showed *** I agree with the radiologist interpretation  The patient was maintained on a cardiac monitor.  I personally viewed and interpreted the cardiac monitored which showed an underlying rhythm of: sinus rhythm and sinus tachycardia  Per my interpretation the patient's ECG shows no acute ischemic findings  I ordered medication including IV solumedrol, IV magnesium, and continuous nebulizers for asthma  I have reviewed the patients home medicines and have made adjustments as needed  Test Considered: ***  I requested consultation with the ***,  and discussed lab and imaging findings as well as pertinent plan - they recommend: ***  After the  interventions noted above, I reevaluated the patient and found that they have: {resolved/improved/worsened:23923::"improved"}  Social Determinants of Health:***  Dispostion:  After consideration of the diagnostic results and the patients response to treatment, I feel that the patent would benefit from ***.   {Document critical care time when appropriate:1} {Document review of labs and clinical decision tools ie heart score, Chads2Vasc2 etc:1}  {Document your independent review of radiology images, and any outside records:1} {Document your discussion with family members, caretakers, and with consultants:1} {Document social determinants of health affecting pt's care:1} {Document your decision making why or why not admission, treatments were needed:1} Final Clinical Impression(s) / ED Diagnoses Final diagnoses:  None    Rx / DC Orders ED Discharge Orders     None

## 2022-08-05 NOTE — ED Notes (Signed)
Patient left before temp could be taken. A fellow RN completed his discharge prior to him waiting for a RX.

## 2022-08-05 NOTE — ED Triage Notes (Signed)
Fever, headache, cough with sob since waking this am at 0300.  Ibuprofen @ 0600 Home neb and inhaler w/o relief.  Hx asthma.  Denies cp.

## 2022-08-05 NOTE — ED Notes (Signed)
Assumed care of this pt. Pt is visually having a hard time breathing. Pt has audible wheezing. Symptoms started at 3 am. Pt tried inhaler and medication at home with no relief. Pt placed on 2 liters [place]. EDP notified.

## 2022-08-05 NOTE — Discharge Instructions (Signed)
For the next 2 days, while awake, please give yourself a nebulizer breathing treatment with albuterol every 4 hours, including when you get home today.  Try to stay indoors in cool air.  Your next dose of decadron steroids is due tomorrow MORNING (08/06/22).

## 2023-06-19 ENCOUNTER — Encounter: Payer: Self-pay | Admitting: Pulmonary Disease

## 2023-08-14 ENCOUNTER — Emergency Department (HOSPITAL_COMMUNITY)

## 2023-08-14 ENCOUNTER — Encounter (HOSPITAL_COMMUNITY): Payer: Self-pay | Admitting: Emergency Medicine

## 2023-08-14 ENCOUNTER — Other Ambulatory Visit: Payer: Self-pay

## 2023-08-14 ENCOUNTER — Emergency Department (HOSPITAL_COMMUNITY)
Admission: EM | Admit: 2023-08-14 | Discharge: 2023-08-14 | Disposition: A | Discharge status: Home / Self Care | Attending: Emergency Medicine | Admitting: Emergency Medicine

## 2023-08-14 DIAGNOSIS — R0602 Shortness of breath: Secondary | ICD-10-CM | POA: Diagnosis present

## 2023-08-14 DIAGNOSIS — Z7951 Long term (current) use of inhaled steroids: Secondary | ICD-10-CM | POA: Insufficient documentation

## 2023-08-14 DIAGNOSIS — J45901 Unspecified asthma with (acute) exacerbation: Secondary | ICD-10-CM | POA: Insufficient documentation

## 2023-08-14 LAB — BASIC METABOLIC PANEL WITH GFR
Anion gap: 12 (ref 5–15)
BUN: 18 mg/dL (ref 6–20)
CO2: 23 mmol/L (ref 22–32)
Calcium: 9.7 mg/dL (ref 8.9–10.3)
Chloride: 105 mmol/L (ref 98–111)
Creatinine, Ser: 1.02 mg/dL (ref 0.61–1.24)
GFR, Estimated: >60 mL/min (ref >60)
Glucose, Bld: 109 mg/dL — ABNORMAL HIGH (ref 70–99)
Potassium: 3.6 mmol/L (ref 3.5–5.1)
Sodium: 140 mmol/L (ref 135–145)

## 2023-08-14 LAB — CBC
HCT: 45.3 % (ref 39.0–52.0)
Hemoglobin: 14.5 g/dL (ref 13.0–17.0)
MCH: 26.7 pg (ref 26.0–34.0)
MCHC: 32 g/dL (ref 30.0–36.0)
MCV: 83.4 fL (ref 80.0–100.0)
Platelets: 155 10*3/uL (ref 150–400)
RBC: 5.43 MIL/uL (ref 4.22–5.81)
RDW: 14.4 % (ref 11.5–15.5)
WBC: 11.7 10*3/uL — ABNORMAL HIGH (ref 4.0–10.5)
nRBC: 0 % (ref 0.0–0.2)

## 2023-08-14 MED ORDER — DEXAMETHASONE 2 MG PO TABS: ORAL_TABLET | ORAL | 0 refills | Status: AC

## 2023-08-14 MED ORDER — ALBUTEROL SULFATE HFA 108 (90 BASE) MCG/ACT IN AERS: 2.0000 | INHALATION_SPRAY | RESPIRATORY_TRACT | 0 refills | Status: AC | PRN

## 2023-08-14 MED ORDER — ALBUTEROL SULFATE HFA 108 (90 BASE) MCG/ACT IN AERS
2.0000 | INHALATION_SPRAY | Freq: Once | RESPIRATORY_TRACT | Status: AC
Start: 2023-08-14 — End: 2023-08-14
  Administered 2023-08-14: 2 via RESPIRATORY_TRACT
  Filled 2023-08-14: qty 6.7

## 2023-08-14 MED ORDER — ALBUTEROL SULFATE (2.5 MG/3ML) 0.083% IN NEBU
10.0000 mg/h | INHALATION_SOLUTION | Freq: Once | RESPIRATORY_TRACT | Status: AC
  Administered 2023-08-14: 10 mg/h via RESPIRATORY_TRACT
  Filled 2023-08-14: qty 3

## 2023-08-14 NOTE — Discharge Instructions (Addendum)
 Return if any problems.

## 2023-08-14 NOTE — ED Triage Notes (Signed)
 Pt BIb [facility] for Resp distress/Asthma Attack. EMS endorses Rhonchi and Wheezing in all fields on scene. 88% RA.  Pt was given 125 Solumedrol, 2G Mag., 1 alb neb and arrived on a Duoneb.  Pt is allergic to prednisone but stated he was able to take the solumedrol.  Pt breathing has improved significantly per EMS.   PT has hx of same with last Asthma attack about a year ago.  Pt has required Cpap/Bipap in the past but never intubation per EMS.   116/70, 100% on neb, HR 106,   18G L. Lower FA.

## 2023-08-14 NOTE — ED Provider Notes (Signed)
 [place] EMERGENCY DEPARTMENT AT [facility] Provider Note   CSN: [id number] Arrival date & time: 08/14/23  1322     History  Chief Complaint  Patient presents with   Shob/Asthma attack    Ralph Robinson is a 48 y.o. male.  Patient complains of an asthma attack today.  Patient has a past medical history of asthma.  Patient called EMS due to shortness of breath.  EMS gave patient a DuoNeb and a albuterol  neb.  Patient had some improvement with neb treatments.  He was given Solu-Medrol  125 IV as well as magnesium  sulfate 2 g.  The history is provided by the patient. No language interpreter was used.       Home Medications Prior to Admission medications   Medication Sig Start Date End Date Taking? Authorizing Provider  albuterol  (VENTOLIN  HFA) 108 (90 Base) MCG/ACT inhaler Inhale 1-2 puffs into the lungs every 6 (six) hours as needed for wheezing or shortness of breath. 05/15/22   Hongalgi, Anand D, MD  atorvastatin (LIPITOR) 20 MG tablet Take 20 mg by mouth at bedtime.    [provider]  budesonide  (PULMICORT ) 0.5 MG/2ML nebulizer solution Take 2 mLs (0.5 mg total) by nebulization every 12 (twelve) hours. 05/15/22   Hongalgi, Anand D, MD  cetirizine (ZYRTEC) 10 MG tablet Take 10 mg by mouth daily.    [provider]  dexamethasone  (DECADRON ) 2 MG tablet Take 3 tabs (6 mg total) daily x 3 days, then 2 tabs (4 mg total) daily x 3 days, then 1 tab (2 mg total) daily x 3 days, then stop. 05/15/22   Hongalgi, Anand D, MD  diclofenac  Sodium (VOLTAREN ) 1 % GEL Apply 4 g topically 4 (four) times daily. Apply to affected painful area of left side of neck/top of shoulder blade. 05/15/22   Casey Clay, MD  Fluticasone-Umeclidin-Vilant (TRELEGY ELLIPTA) 200-62.5-25 MCG/ACT AEPB Inhale into the lungs.    [provider]  guaiFENesin  (ROBITUSSIN) 100 MG/5ML liquid Take 5 mLs by mouth every 4 (four) hours as needed for cough or to loosen phlegm. 05/15/22    Hongalgi, Anand D, MD  ibuprofen (ADVIL) 200 MG tablet Take 600 mg by mouth every 8 (eight) hours as needed for mild pain.    [provider]  ipratropium (ATROVENT ) 0.02 % nebulizer solution Take 2.5 mLs (0.5 mg total) by nebulization every 4 (four) hours as needed for wheezing or shortness of breath. 08/05/22   Arvilla Birmingham, MD  ipratropium-albuterol  (DUONEB) 0.5-2.5 (3) MG/3ML SOLN Take 3 mLs by nebulization every 6 (six) hours as needed (wheezing). 05/05/22   [provider]  montelukast  (SINGULAIR ) 10 MG tablet Take 10 mg by mouth at bedtime.    [provider]  omeprazole (PRILOSEC) 20 MG capsule Take 20 mg by mouth daily as needed Tonna Frederic). 04/01/22   [provider]  sertraline  (ZOLOFT ) 100 MG tablet Take 100 mg by mouth at bedtime. 04/03/22   [provider]  sertraline  (ZOLOFT ) 50 MG tablet Take 2 tablets (100 mg total) by mouth at bedtime. 08/05/22 08/14/23  Arvilla Birmingham, MD  Vitamin D, Ergocalciferol, (DRISDOL) 1.25 MG (50000 UNIT) CAPS capsule Take 50,000 Units by mouth once a week. Tuesday 04/01/22   [provider]      Allergies    Beef-derived drug products and Prednisone    Review of Systems   Review of Systems  Constitutional:  Negative for fever.  Respiratory:  Positive for chest tightness, shortness of breath  and wheezing.   All other systems reviewed and are negative.   Physical Exam Updated Vital Signs BP 114/71   Pulse (!) 106   Temp 97.8 F (36.6 C)   Resp (!) 24   Ht 5\' 6"  (1.676 m)   Wt 81.6 kg   SpO2 100%   BMI 29.05 kg/m  Physical Exam Vitals and nursing note reviewed.  Constitutional:      Appearance: He is well-developed.  HENT:     Head: Normocephalic.     Mouth/Throat:     Mouth: Mucous membranes are moist.  Eyes:     Extraocular Movements: Extraocular movements intact.     Pupils: Pupils are equal, round, and reactive to light.  Cardiovascular:     Rate and Rhythm: Normal rate.   Pulmonary:     Effort: Pulmonary effort is normal.     Breath sounds: Wheezing and rhonchi present.  Abdominal:     General: There is no distension.  Musculoskeletal:        General: Normal range of motion.  Skin:    General: Skin is warm.  Neurological:     General: No focal deficit present.     Mental Status: He is alert and oriented to person, place, and time.  Psychiatric:        Mood and Affect: Mood normal.     ED Results / Procedures / Treatments   Labs (all labs ordered are listed, but only abnormal results are displayed) Labs Reviewed  CBC - Abnormal; Notable for the following components:      Result Value   WBC 11.7 (*)    All other components within normal limits  BASIC METABOLIC PANEL WITH GFR    EKG EKG Interpretation Date/Time:  Monday Aug 14 2023 13:35:40 EDT Ventricular Rate:  105 PR Interval:  129 QRS Duration:  86 QT Interval:  341 QTC Calculation: 451 R Axis:   79  Text Interpretation: Sinus tachycardia Abnormal R-wave progression, early transition No significant change since prior 5/24 Confirmed by Racheal Buddle [phone]) on 08/14/2023 1:48:22 PM  Radiology DG Chest 2 View Result Date: 08/14/2023 CLINICAL DATA:  Shortness of breath.  Asthma. EXAM: CHEST - 2 VIEW COMPARISON:  01/15/2023. FINDINGS: Bilateral lung fields are clear. Bilateral costophrenic angles are clear. Normal cardio-mediastinal silhouette. No acute osseous abnormalities. The soft tissues are within normal limits. IMPRESSION: *No active cardiopulmonary disease. Electronically Signed   By: Beula Brunswick M.D.   On: 08/14/2023 14:21    Procedures Procedures    Medications Ordered in ED Medications  albuterol  (PROVENTIL ,VENTOLIN ) solution continuous neb (has no administration in time range)    ED Course/ Medical Decision Making/ A&P Clinical Course as of 08/14/23 1437  Mon Aug 14, 2023  7962 48 year old with history of asthma here with significant shortness of breath.  He is  wheezing in all fields.  Received steroids breathing treatments and magnesium  by EMS.  Getting labs and imaging.  Disposition per results of testing. [MB]    Clinical Course User Index [MB] Tonya Fredrickson, MD                                 Medical Decision Making Patient complains of an asthma attack today.  He reports he is feeling better after EMS gave him 2 breathing treatments.  Patient has past medical history of asthma  Amount and/or Complexity of Data Reviewed Independent Historian: EMS  Details: EMS reports O2 sat was 88 before treatment. Labs: ordered. Decision-making details documented in ED Course.    Details: Labs ordered reviewed and interpreted.  White blood cell count is 11.7. Radiology: ordered and independent interpretation performed. Decision-making details documented in ED Course.    Details: Chest xray  no acute findings.    Risk Prescription drug management. Risk Details: Patient reports he feels back to normal.  Patient wants to go home.  Patient's O2 sats are 100%.  Patient given a prescription for dexamethasone .  He is advised to continue albuterol  inhaler.  Return to the emergency department if symptoms worsen or change.           Final Clinical Impression(s) / ED Diagnoses Final diagnoses:  Moderate asthma with exacerbation, unspecified whether persistent    Rx / DC Orders ED Discharge Orders          Ordered    dexamethasone  (DECADRON ) 2 MG tablet        08/14/23 1540              Velna Hedgecock K, PA-C 08/14/23 1548    Tonya Fredrickson, MD 08/14/23 [phone]

## 2024-01-22 ENCOUNTER — Ambulatory Visit (INDEPENDENT_AMBULATORY_CARE_PROVIDER_SITE_OTHER): Payer: Self-pay

## 2024-01-22 ENCOUNTER — Other Ambulatory Visit: Payer: Self-pay | Admitting: Family Medicine

## 2024-01-22 DIAGNOSIS — R52 Pain, unspecified: Secondary | ICD-10-CM

## 2024-01-22 DIAGNOSIS — S67197A Crushing injury of left little finger, initial encounter: Secondary | ICD-10-CM
